# Patient Record
Sex: Male | Born: 1965
Health system: Southern US, Community
[De-identification: ages and names within clinical notes are randomized; demographics above are authoritative.]

## PROBLEM LIST (undated history)

## (undated) DIAGNOSIS — E039 Hypothyroidism, unspecified: Secondary | ICD-10-CM

## (undated) HISTORY — PX: WRIST SURGERY: SHX841

---
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
## 2012-10-10 ENCOUNTER — Emergency Department: Payer: Self-pay | Admitting: Emergency Medicine

## 2012-10-10 LAB — CBC
HCT: 43 % (ref 40.0–52.0)
MCH: 30.7 pg (ref 26.0–34.0)
Platelet: 239 10*3/uL (ref 150–440)
RBC: 4.8 10*6/uL (ref 4.40–5.90)

## 2012-10-10 LAB — COMPREHENSIVE METABOLIC PANEL
Albumin: 3.6 g/dL (ref 3.4–5.0)
Alkaline Phosphatase: 70 U/L (ref 50–136)
Anion Gap: 5 — ABNORMAL LOW (ref 7–16)
Bilirubin,Total: 0.5 mg/dL (ref 0.2–1.0)
Chloride: 106 mmol/L (ref 98–107)
Co2: 24 mmol/L (ref 21–32)
Creatinine: 0.85 mg/dL (ref 0.60–1.30)
EGFR (Non-African Amer.): >60
Glucose: 93 mg/dL (ref 65–99)
Potassium: 5.5 mmol/L — ABNORMAL HIGH (ref 3.5–5.1)
Sodium: 135 mmol/L — ABNORMAL LOW (ref 136–145)
Total Protein: 7.6 g/dL (ref 6.4–8.2)

## 2012-10-10 IMAGING — CT CT CERVICAL SPINE WITHOUT CONTRAST
1 series · 12 of 14 positions shown, 15 images · non-contrast
Comparison: None

REASON FOR EXAM: pain following trauma
COMMENTS:

PROCEDURE:     CT  - CT CERVICAL SPINE WO  - [DATE] [DATE]
RESULT:     Clinical Indication: Trauma
TECHNIQUE: Multiple axial CT images from the skull base to the mid vertebral
body of T1. obtained with sagittal and coronal reformatted images provided.

[Series 5: axial · axial · 0.32mm/px · z∈[-282,-111]mm · 12 of 102 slices shown, 15 images]
[im 8/102  soft-tissue]
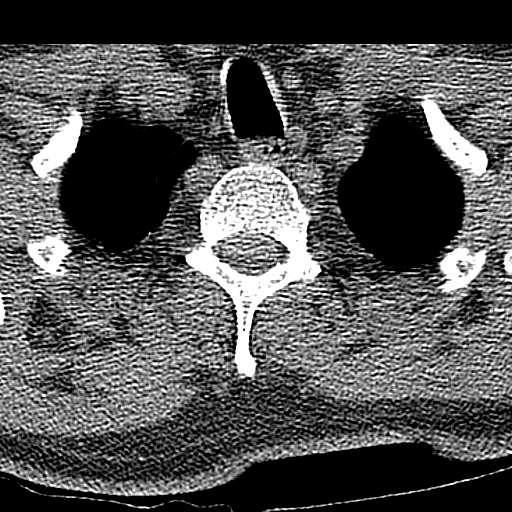
[im 8/102  bone]
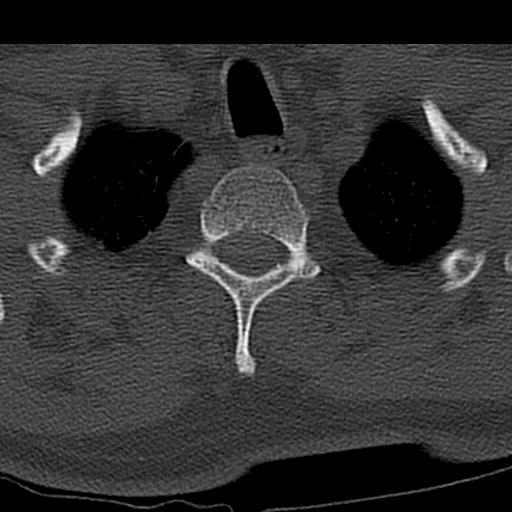
[im 16/102  bone]
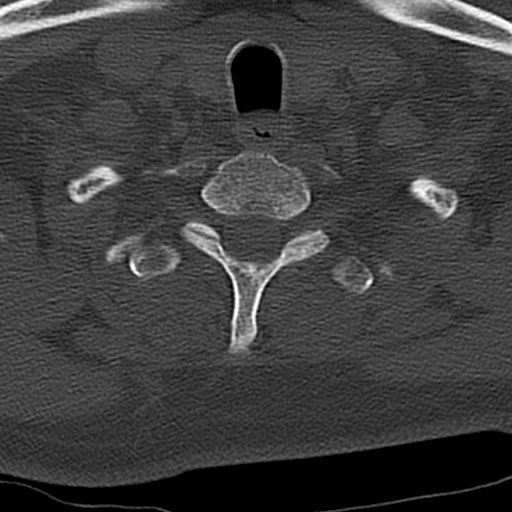
[im 24/102  bone]
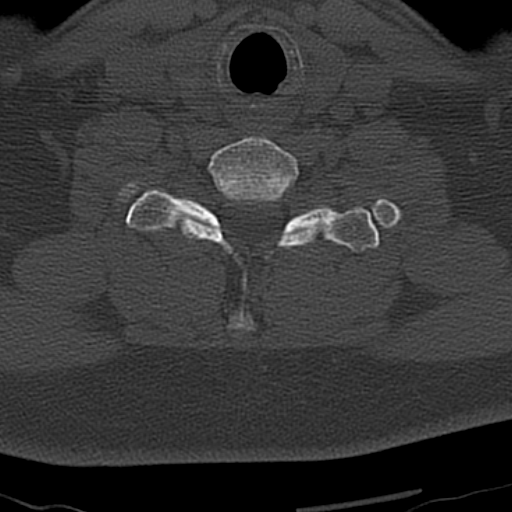
[im 32/102  bone]
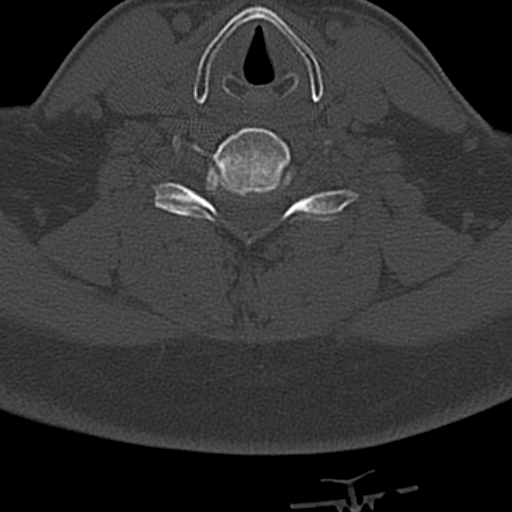
[im 39/102  soft-tissue]
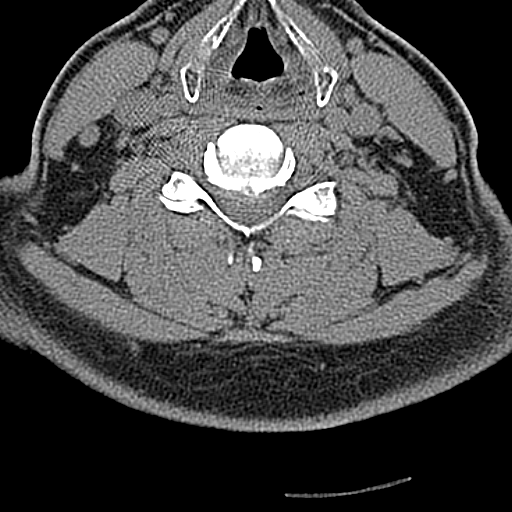
[im 39/102  bone]
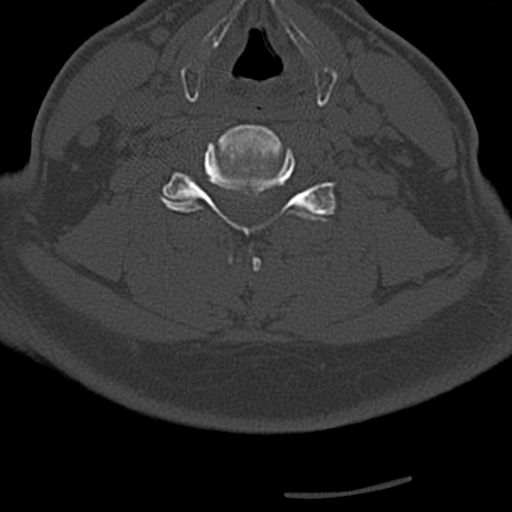
[im 47/102  bone]
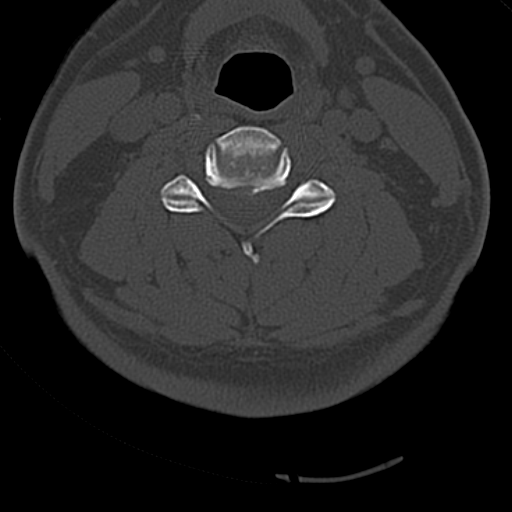
[im 55/102  bone]
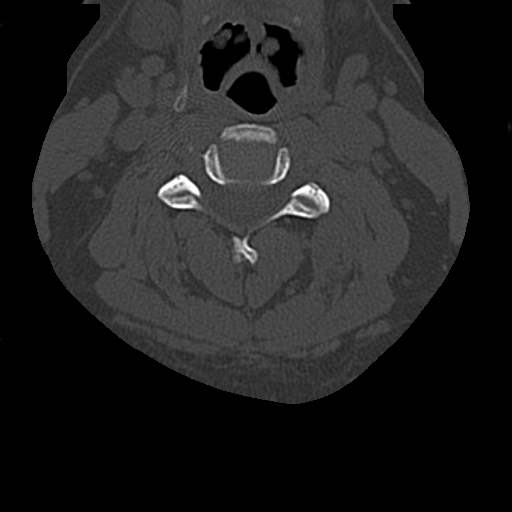
[im 63/102  bone]
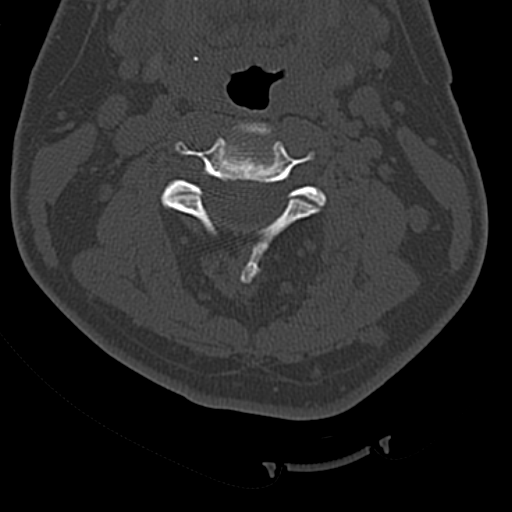
[im 70/102  soft-tissue]
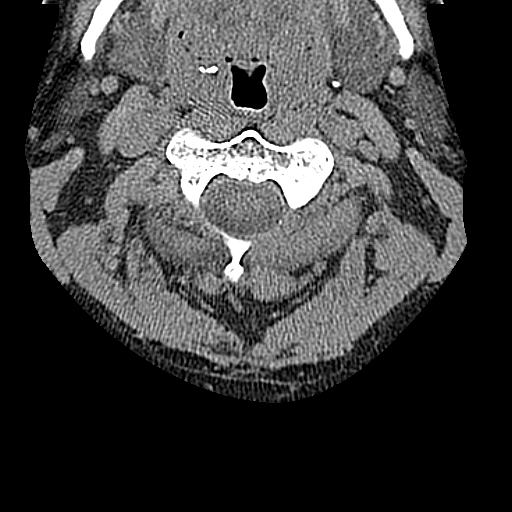
[im 70/102  bone]
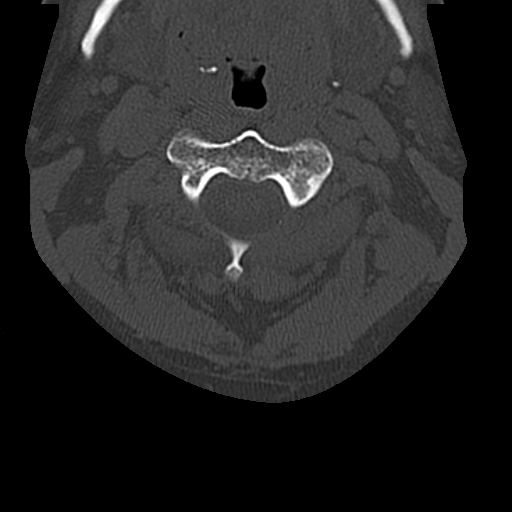
[im 78/102  bone]
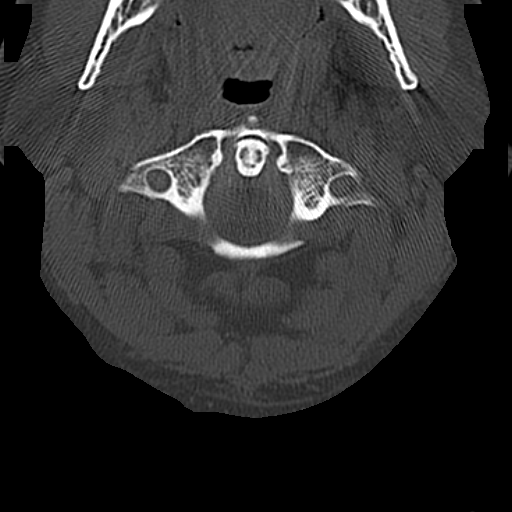
[im 86/102  bone]
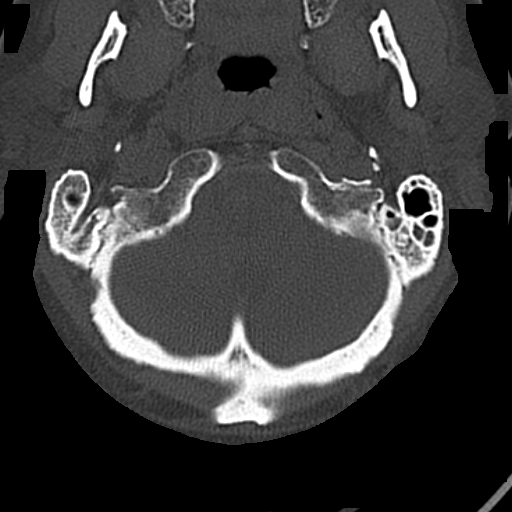
[im 94/102  bone]
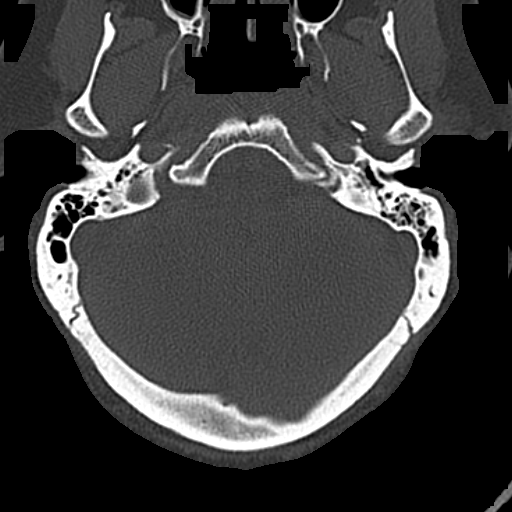

[12 of 14 positions shown; findings below may reference images not displayed]

FINDINGS: The alignment is anatomic. The vertebral body heights are maintained. There
is no acute fracture or static listhesis. The prevertebral soft tissues are
normal. The intraspinal soft tissues are not fully imaged on this
examination due to poor soft tissue contrast, but there is no soft tissue
gross abnormality.

There are broad-based discussed you by complexes at C4-C5, C5-C6 and C6-C7.

The visualized portions of the lung apices demonstrate no focal abnormality.
IMPRESSION: 1. No acute osseous injury of the cervical spine.

2. Ligamentous injury is not evaluated. If there is high clinical concern
for ligamentous injury, consider MRI or flexion/extension radiographs as
clinically indicated and tolerated.

[REDACTED]

## 2012-10-10 IMAGING — CT CT CHEST-ABD-PELV W/ CM
1 of 2 series · 15 of 32 positions shown, 19 images · non-contrast
Comparison: None

REASON FOR EXAM: (1) pain following trauma; (2) pain following trauma
COMMENTS:

PROCEDURE:     CT  - CT CHEST ABDOMEN AND PELVIS W  - [DATE] [DATE]
RESULT:     CT CHEST, ABDOMEN, AND PELVIS
HISTORY: Pain following trauma
TECHNIQUE: Multiple axial images obtained from the thoracic inlet to the
pubic symphysis, without p.o. contrast and with 100 ml of [DN]
intravenous contrast.

[Series 2: soft tissue · axial · 0.80mm/px · z∈[-926,-266]mm · 15 of 244 slices shown, 19 images]
[im 12/244  soft-tissue]
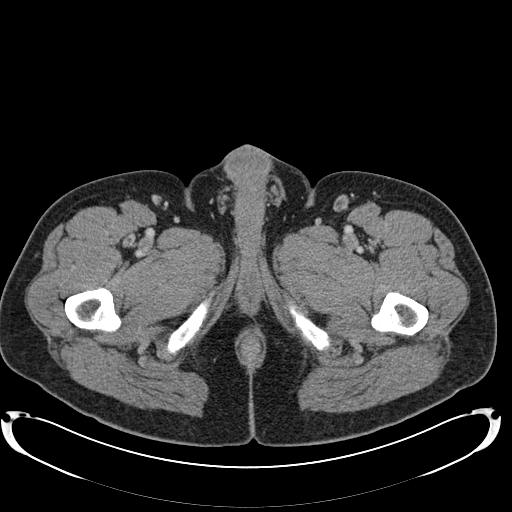
[im 12/244  bone]
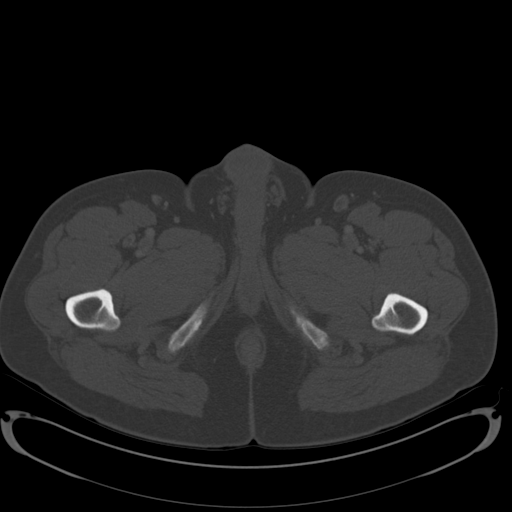
[im 34/244  soft-tissue]
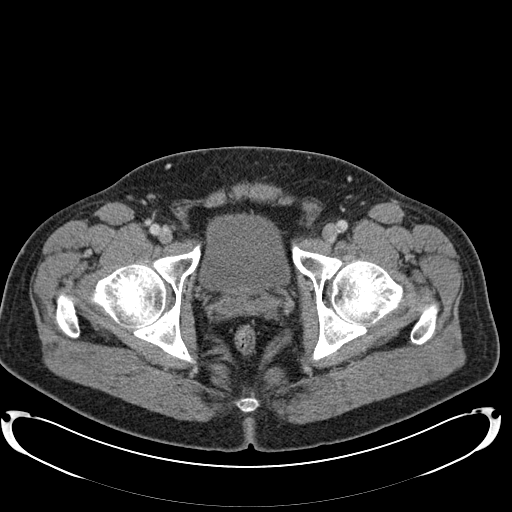
[im 56/244  soft-tissue]
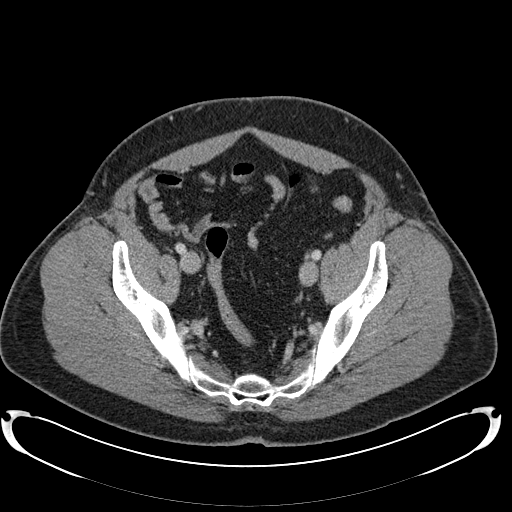
[im 67/244  soft-tissue]
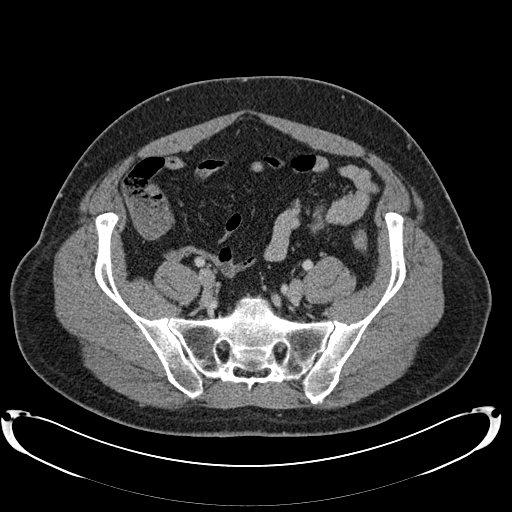
[im 89/244  soft-tissue]
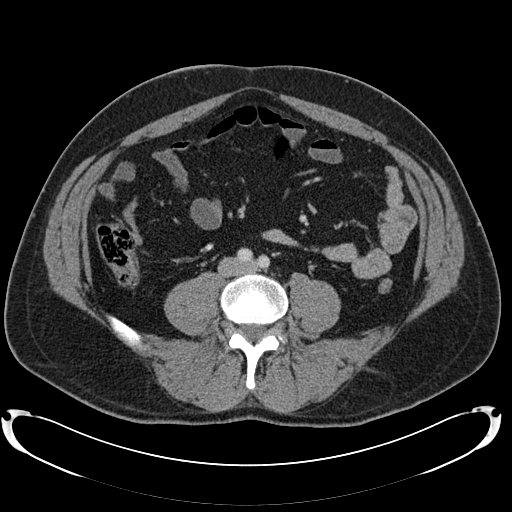
[im 100/244  soft-tissue]
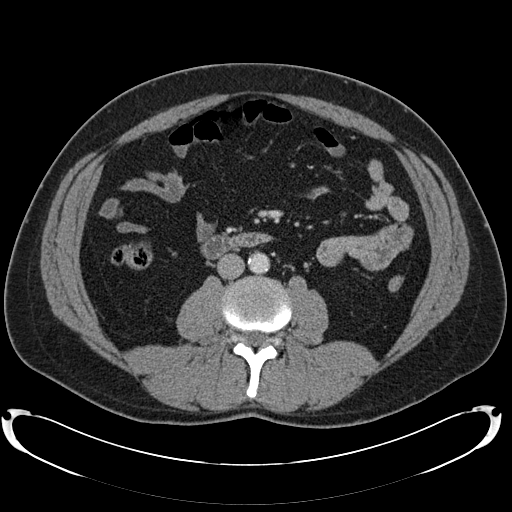
[im 122/244  soft-tissue]
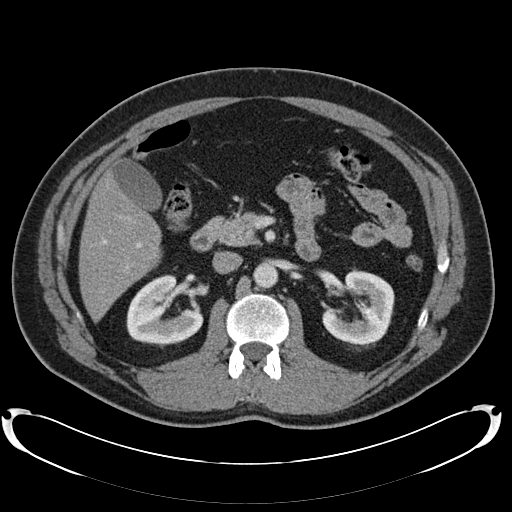
[im 144/244  soft-tissue]
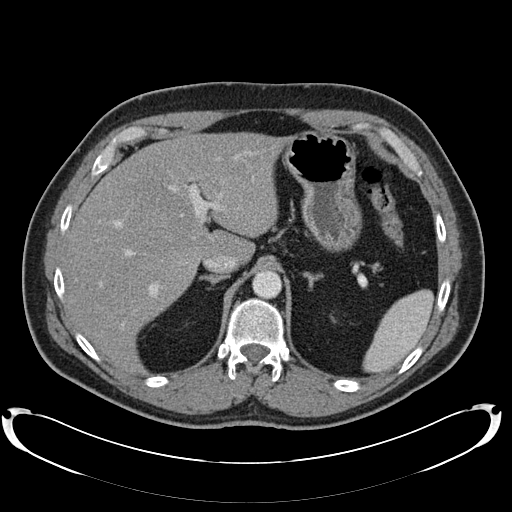
[im 155/244  soft-tissue]
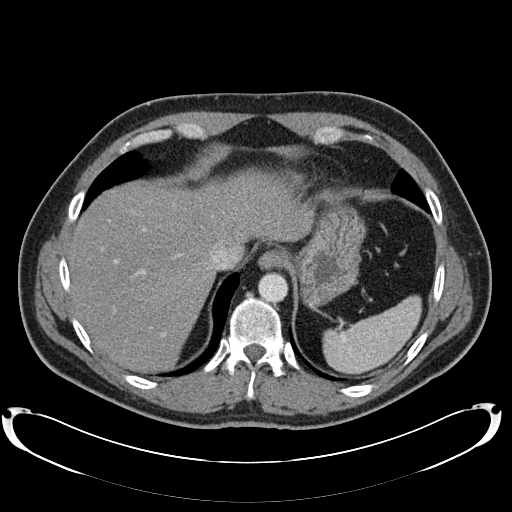
[im 155/244  bone]
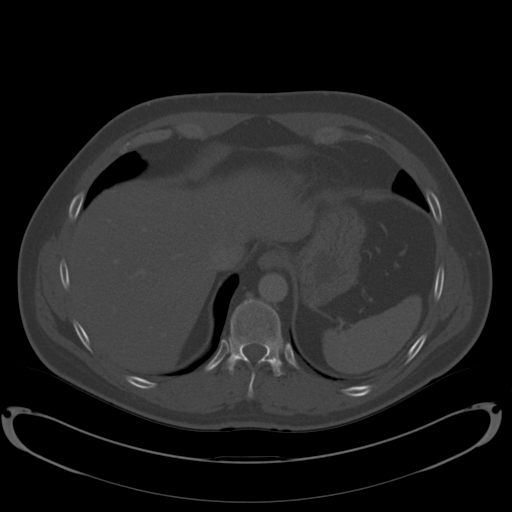
[im 177/244  soft-tissue]
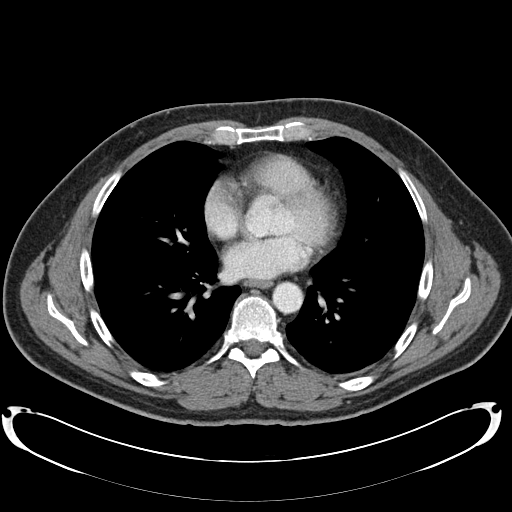
[im 188/244  soft-tissue]
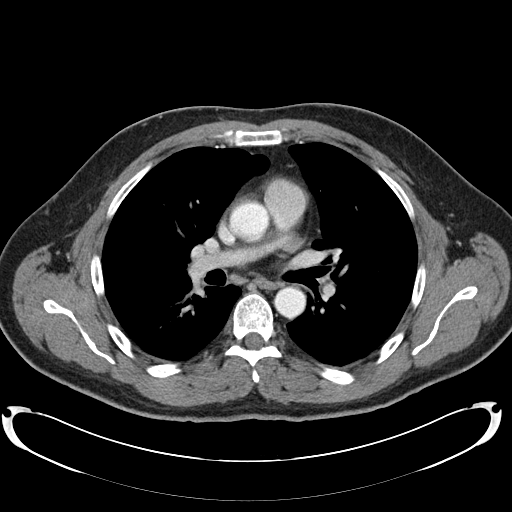
[im 199/244  lung]
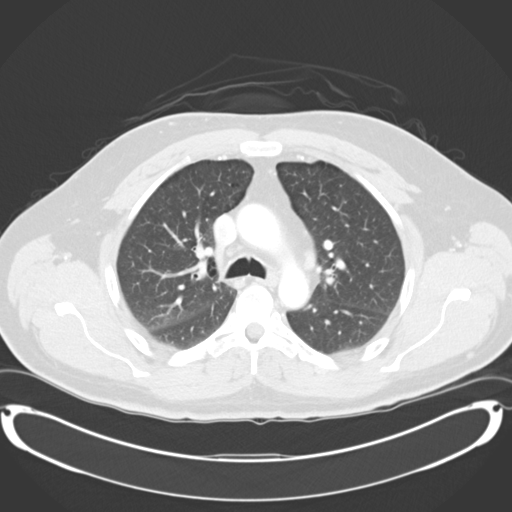
[im 210/244  soft-tissue]
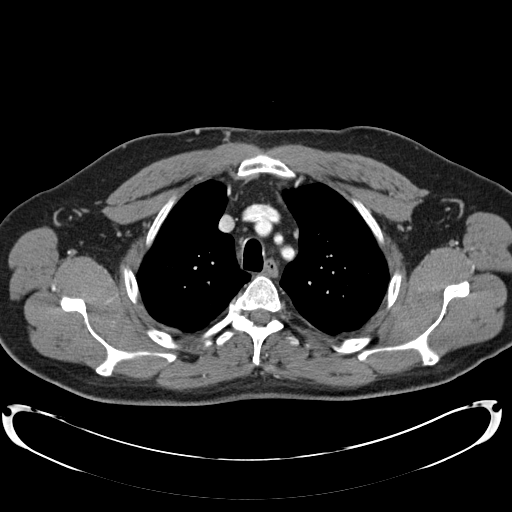
[im 210/244  lung]
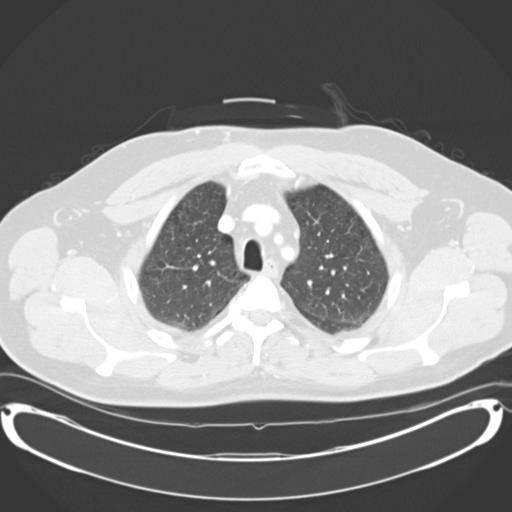
[im 221/244  lung]
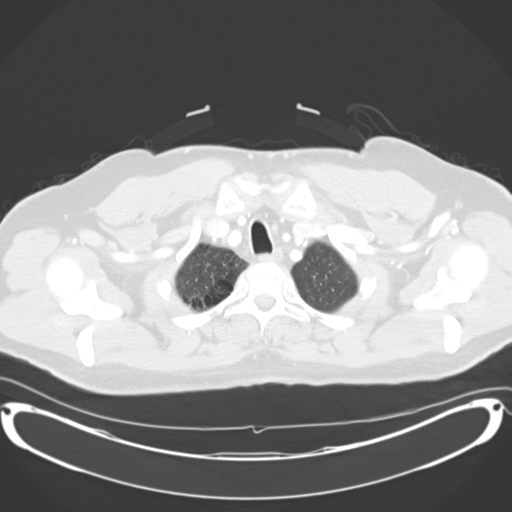
[im 232/244  soft-tissue]
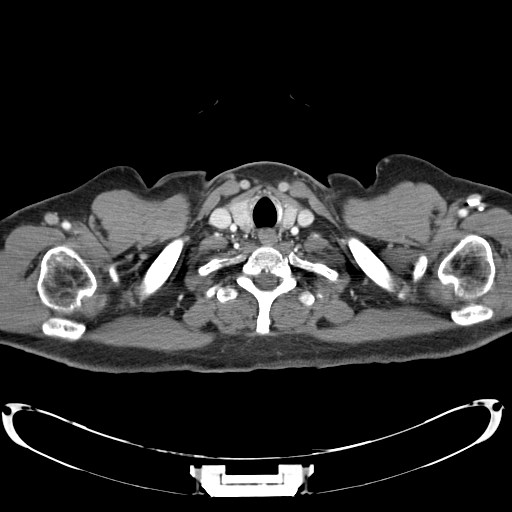
[im 232/244  lung]
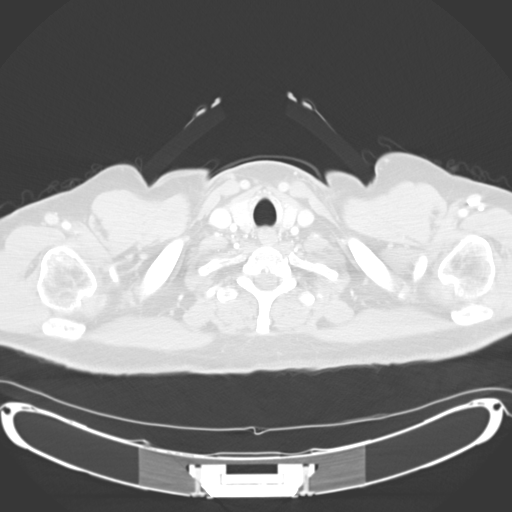

[15 of 32 positions shown; findings below may reference images not displayed]

FINDINGS: CHEST:

The lungs are clear. There are multiple right apical blebs. There is no
focal mass. There is no focal parenchymal opacity, pleural effusion, or
pneumothorax.

The heart size is normal. There is no pericardial effusion.

There are no pathologically enlarged mediastinal, hilar, or axillary lymph
nodes.

The osseous structures demonstrate no focal abnormality.

ABDOMEN/PELVIS:

The liver demonstrates no focal abnormality. There is no intrahepatic or
extrahepatic biliary ductal dilatation. The gallbladder is unremarkable. The
spleen demonstrates no focal abnormality. The kidneys, adrenal glands,
pancreas are normal. The bladder is unremarkable.

The stomach, duodenum, small intestine, and large intestine demonstrate no
contrast extravasation or dilatation. There is no pneumoperitoneum,
pneumatosis, or portal venous gas. There is no abdominal or pelvic free
fluid. There is no lymphadenopathy.

The abdominal aorta is normal in caliber with atherosclerosis.

The osseous structures are unremarkable.
IMPRESSION: 1. No acute injury of the chest, abdomen or pelvis.

[REDACTED]

## 2012-10-10 IMAGING — CR RIGHT FOREARM - 2 VIEW
1 series · 2 of 2 positions shown · non-contrast
Comparison: none

REASON FOR EXAM: pain following trauma
COMMENTS:

PROCEDURE:     DXR - DXR FOREARM RIGHT  - [DATE] [DATE]
RESULT:     Comparison: None.

[Series 1: ap · 0.17mm/px · 2 of 2 slices shown]
[im 1/2]
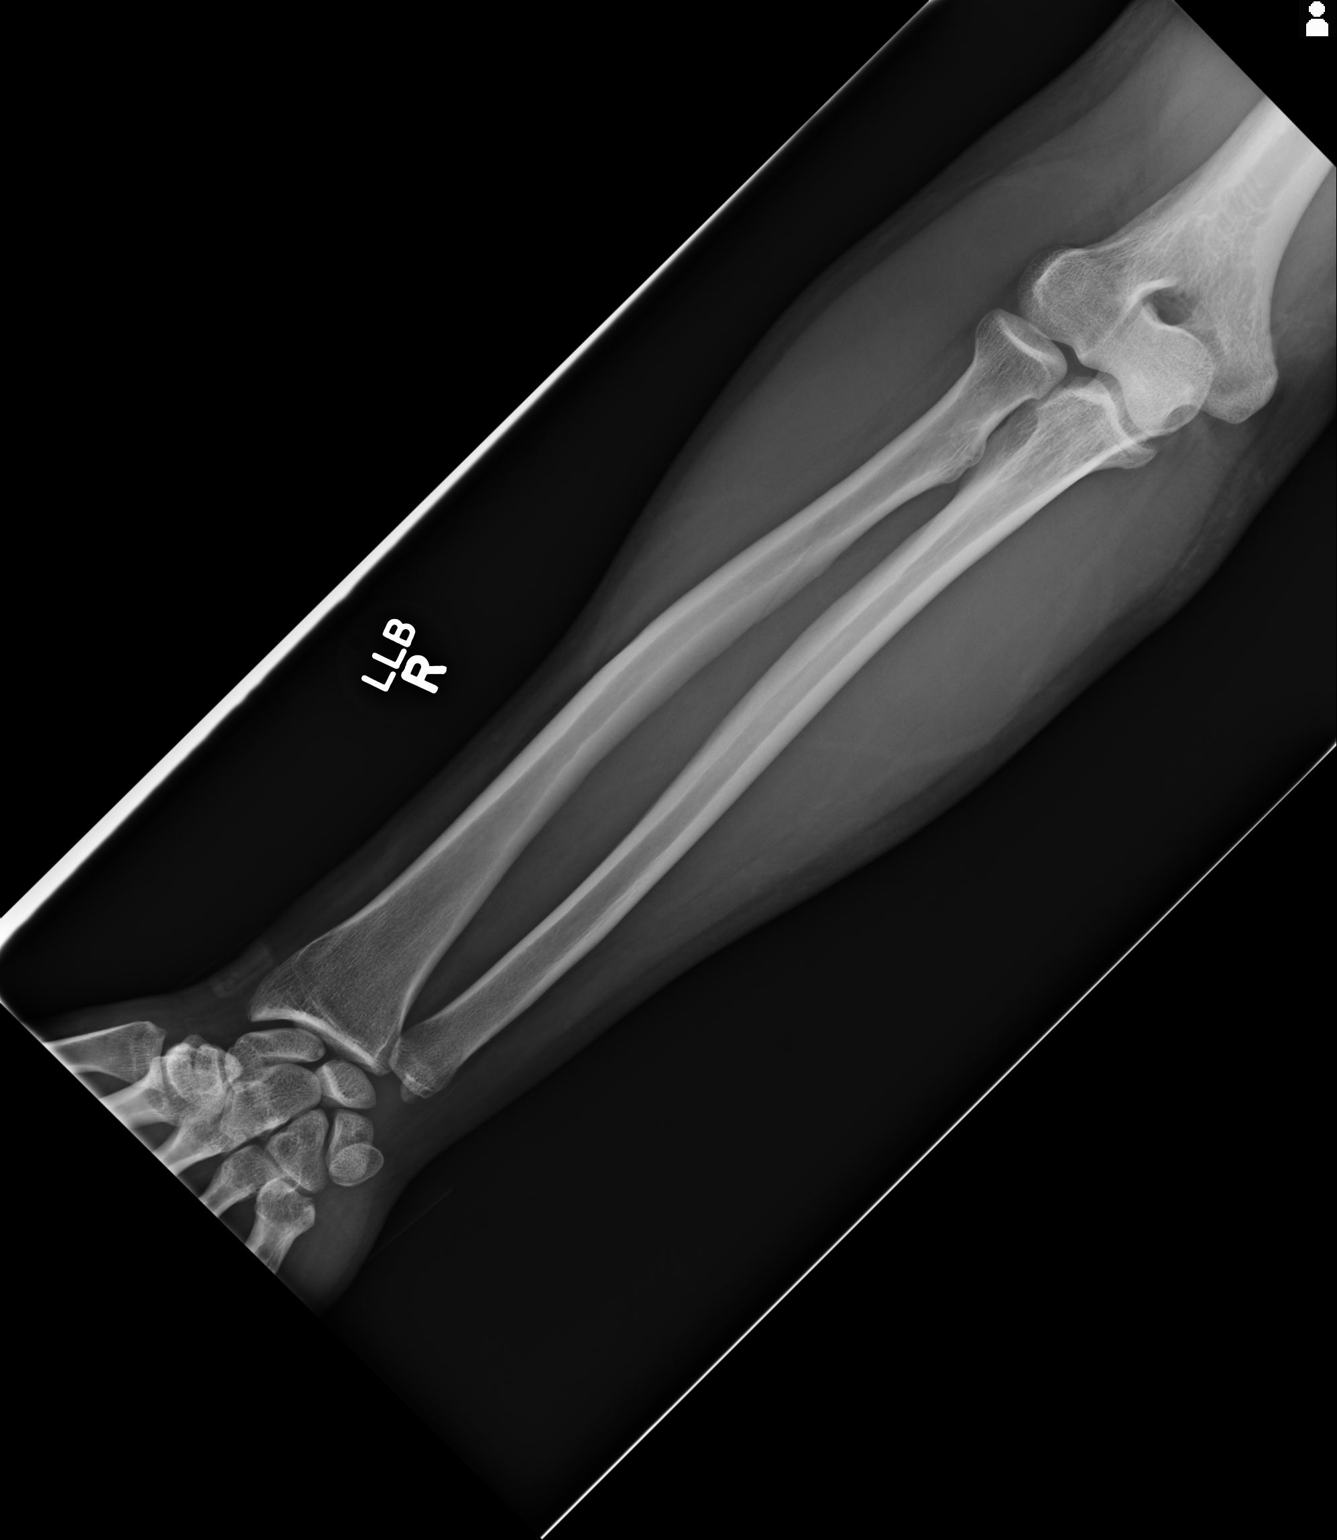
[im 2/2]
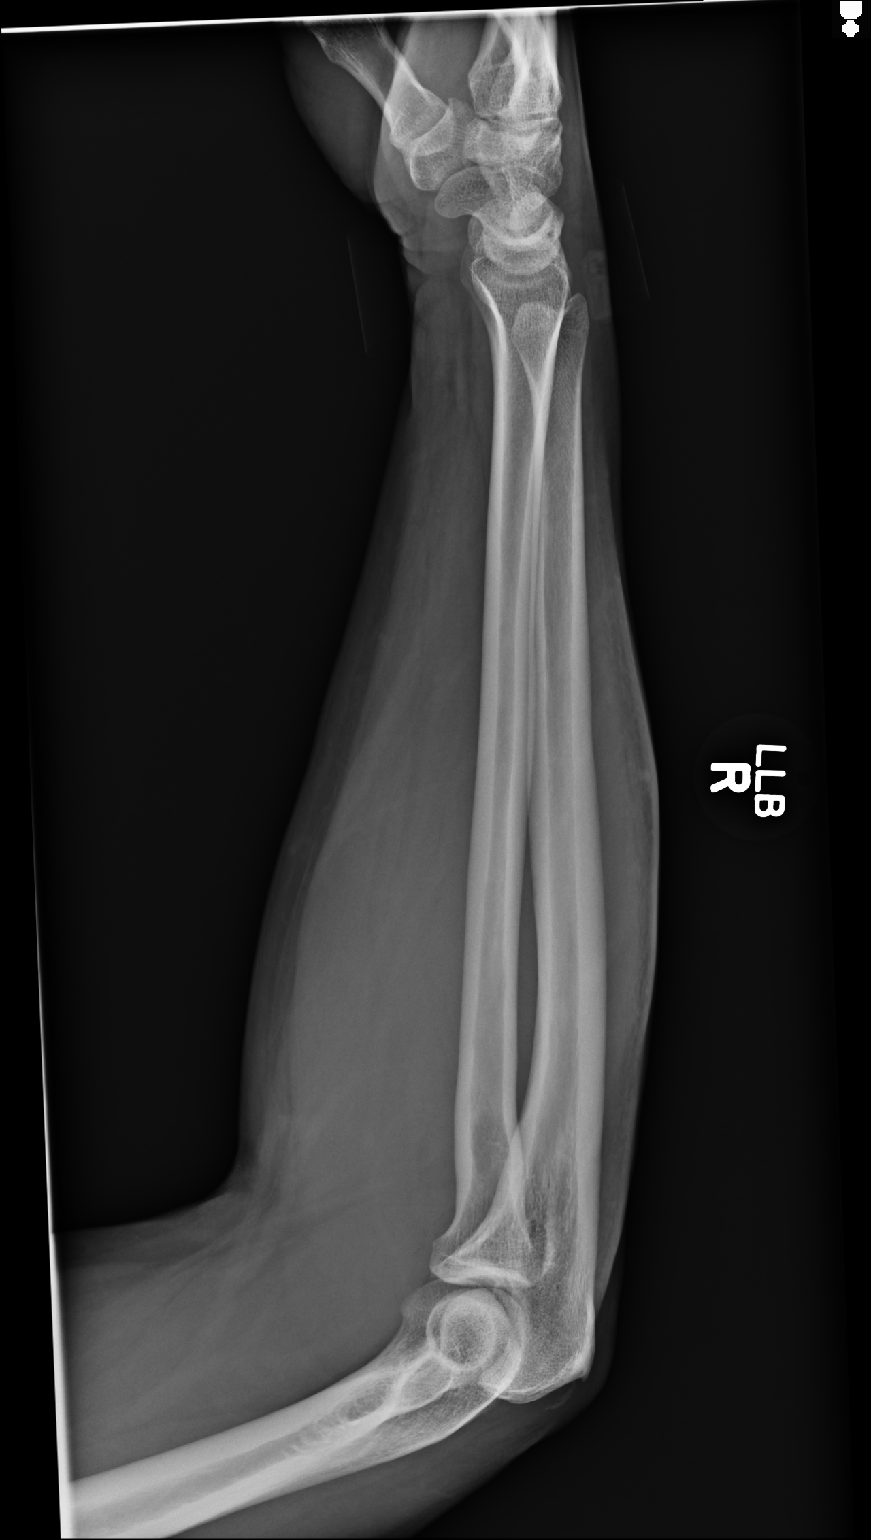

[2 of 2 positions shown; findings below may reference images not displayed]

FINDINGS: No acute fracture.
IMPRESSION: No acute fracture.

[REDACTED]

## 2016-09-14 ENCOUNTER — Ambulatory Visit: Payer: Self-pay | Admitting: Family Medicine

## 2017-09-11 ENCOUNTER — Encounter: Payer: Self-pay | Admitting: Physician Assistant

## 2017-09-11 ENCOUNTER — Ambulatory Visit: Payer: 59 | Admitting: Physician Assistant

## 2017-09-11 VITALS — BP 124/70 | HR 86 | Temp 98.5°F | Resp 16 | Ht 72.0 in | Wt 230.0 lb

## 2017-09-11 DIAGNOSIS — Z131 Encounter for screening for diabetes mellitus: Secondary | ICD-10-CM | POA: Diagnosis not present

## 2017-09-11 DIAGNOSIS — Z114 Encounter for screening for human immunodeficiency virus [HIV]: Secondary | ICD-10-CM | POA: Diagnosis not present

## 2017-09-11 DIAGNOSIS — J309 Allergic rhinitis, unspecified: Secondary | ICD-10-CM

## 2017-09-11 DIAGNOSIS — Z720 Tobacco use: Secondary | ICD-10-CM | POA: Diagnosis not present

## 2017-09-11 DIAGNOSIS — Z23 Encounter for immunization: Secondary | ICD-10-CM | POA: Diagnosis not present

## 2017-09-11 DIAGNOSIS — Z13220 Encounter for screening for lipoid disorders: Secondary | ICD-10-CM | POA: Diagnosis not present

## 2017-09-11 DIAGNOSIS — Z130 Encounter for screening for diseases of the blood and blood-forming organs and certain disorders involving the immune mechanism: Secondary | ICD-10-CM | POA: Diagnosis not present

## 2017-09-11 DIAGNOSIS — Z0000 Encounter for general adult medical examination without abnormal findings: Secondary | ICD-10-CM

## 2017-09-11 DIAGNOSIS — Z1329 Encounter for screening for other suspected endocrine disorder: Secondary | ICD-10-CM

## 2017-09-11 DIAGNOSIS — Z1211 Encounter for screening for malignant neoplasm of colon: Secondary | ICD-10-CM

## 2017-09-11 NOTE — Progress Notes (Signed)
Patient: Benjamin Oliver Male    DOB: 07-Jan-1966   53 y.o.   MRN: [id number] Visit Date: 09/13/2017  Today's Provider: Trinna Post, PA-C   Chief Complaint  Patient presents with  . New Patient (Initial Visit)   Subjective:    HPI   Patient comes in today to establish care, would like a physical exam today. He is a Careers information officer. 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 He has one biological child, and his wife has 5 other children.   He is currently smoking 1.5 packs per day, has been doing so since age 62. He does desire to quit. He does not want chantix. He reports his brother is currently dying of lung cancer.  Does not recall his last tetanus and he is visiting is daughter who just gave birth to new grandchild.       No Known Allergies   Current Outpatient Medications:  .  loratadine (CLARITIN) 10 MG tablet, Take 10 mg by mouth daily., Disp: , Rfl:  .  nicotine (NICODERM CQ) 21 mg/24hr patch, Place 1 patch (21 mg total) onto the skin daily for 28 days., Disp: 28 patch, Rfl: 0  Review of Systems  Constitutional: Negative.   HENT: Positive for congestion and sinus pressure.   Eyes: Negative.   Respiratory: Positive for cough.   Cardiovascular: Negative.   Gastrointestinal: Negative.   Endocrine: Negative.   Genitourinary: Negative.   Musculoskeletal: Negative.   Skin: Negative.   Allergic/Immunologic: Positive for environmental allergies.  Neurological: Negative.   Hematological: Negative.   Psychiatric/Behavioral: Negative.     Social History   Tobacco Use  . Smoking status: Not on file  Substance Use Topics  . Alcohol use: Not on file   Objective:   BP 124/70 (BP Location: Right Leg, Patient Position: Sitting, Cuff Size: Large)   Pulse 86   Temp 98.5 F (36.9 C)   Resp 16   Ht 6' (1.829 m)   Wt 230 lb (104.3 kg)   SpO2 99%   BMI 31.19 kg/m  Vitals:   09/11/17 1413  BP: 124/70  Pulse: 86  Resp: 16  Temp:  98.5 F (36.9 C)  SpO2: 99%  Weight: 230 lb (104.3 kg)  Height: 6' (1.829 m)     Physical Exam  Constitutional: He is oriented to person, place, and time. He appears well-developed and well-nourished.  HENT:  Head: Normocephalic and atraumatic.  Right Ear: External ear normal.  Left Ear: External ear normal.  Nose: Nose normal.  Mouth/Throat: No oropharyngeal exudate.  Eyes: Conjunctivae are normal. Right eye exhibits no discharge. Left eye exhibits no discharge.  Neck: Neck supple.  Cardiovascular: Normal rate and regular rhythm.  Pulmonary/Chest: Effort normal and breath sounds normal.  Abdominal: Soft. Bowel sounds are normal.  Lymphadenopathy:    He has no cervical adenopathy.  Neurological: He is alert and oriented to person, place, and time.  Skin: Skin is warm and dry.  Psychiatric: He has a normal mood and affect. His behavior is normal.        Assessment & Plan:     1. Annual physical exam   2. Allergic rhinitis, unspecified seasonality, unspecified trigger   3. Tobacco abuse  - nicotine (NICODERM CQ) 21 mg/24hr patch; Place 1 patch (21 mg total) onto the skin daily for 28 days.  Dispense: 28 patch; Refill: 0  4. Diabetes mellitus screening  - Comprehensive Metabolic Panel (  CMET)  5. Screening cholesterol level  - Lipid Profile  6. Thyroid disorder screening  - TSH  7. Screening for deficiency anemia  - CBC with Differential  8. Colon cancer screening  - Ambulatory referral to Gastroenterology  9. Need for Tdap vaccination  - Tdap vaccine greater than or equal to 7yo IM  10. Encounter for screening for HIV  - HIV antibody (with reflex)  Return in about 2 months (around 11/11/2017) for smoking.  The entirety of the information documented in the History of Present Illness, Review of Systems and Physical Exam were personally obtained by me. Portions of this information were initially documented by Wilburt Finlay, CMA and reviewed by me for  thoroughness and accuracy.           Trinna Post, PA-C  [facility]

## 2017-09-11 NOTE — Patient Instructions (Addendum)
Please take daily allergy medication. Do NOT take Mucinex D daily, or anything with a decongestant daily. May add flonase, 2 sprays each nostril daily.     Tobacco Use Disorder Tobacco use disorder (TUD) is a mental disorder. It is the long-term use of tobacco in spite of related health problems or difficulty with normal life activities. Tobacco is most commonly smoked as cigarettes and less commonly as cigars or pipes. Smokeless chewing tobacco and snuff are also popular. People with TUD get a feeling of extreme pleasure (euphoria) from using tobacco and have a desire to use it again and again. Repeated use of tobacco can cause problems. The addictive effects of tobacco are due mainly tothe ingredient nicotine. Nicotine also causes a rush of adrenaline (epinephrine) in the body. This leads to increased blood pressure, heart rate, and breathing rate. These changes may cause problems for people with high blood pressure, weak hearts, or lung disease. High doses of nicotine in children and pets can lead to seizures and death. Tobacco contains a number of other unsafe chemicals. These chemicals are especially harmful when inhaled as smoke and can damage almost every organ in the body. Smokers live shorter lives than nonsmokers and are at risk of dying from a number of diseases and cancers. Tobacco smoke can also cause health problems for nonsmokers (due to inhaling secondhand smoke). Smoking is also a fire hazard. TUD usually starts in the late teenage years and is most common in young adults between the ages of 70 and 1 years. People who start smoking earlier in life are more likely to continue smoking as adults. TUD is somewhat more common in men than women. People with TUD are at higher risk for using alcohol and other drugs of abuse. What increases the risk? Risk factors for TUD include:  Having family members with the disorder.  Being around people who use tobacco.  Having an existing mental  health issue such as schizophrenia, depression, bipolar disorder, ADHD, or posttraumatic stress disorder (PTSD).  What are the signs or symptoms? People with tobacco use disorder have two or more of the following signs and symptoms within 12 months:  Use of more tobacco over a longer period than intended.  Not able to cut down or control tobacco use.  A lot of time spent obtaining or using tobacco.  Strong desire or urge to use tobacco (craving). Cravings may last for 6 months or longer after quitting.  Use of tobacco even when use leads to major problems at work, school, or home.  Use of tobacco even when use leads to relationship problems.  Giving up or cutting down on important life activities because of tobacco use.  Repeatedly using tobacco in situations where it puts you or others in physical danger, like smoking in bed.  Use of tobacco even when it is known that a physical or mental problem is likely related to tobacco use. ? Physical problems are numerous and may include chronic bronchitis, emphysema, lung and other cancers, gum disease, high blood pressure, heart disease, and stroke. ? Mental problems caused by tobacco may include difficulty sleeping and anxiety.  Need to use greater amounts of tobacco to get the same effect. This means you have developed a tolerance.  Withdrawal symptoms as a result of stopping or rapidly cutting back use. These symptoms may last a month or more after quitting and include the following: ? Depressed, anxious, or irritable mood. ? Difficulty concentrating. ? Increased appetite. ? Restlessness or trouble sleeping. ?  Use of tobacco to avoid withdrawal symptoms.  How is this diagnosed? Tobacco use disorder is diagnosed by your health care provider. A diagnosis may be made by:  Your health care provider asking questions about your tobacco use and any problems it may be causing.  A physical exam.  Lab tests.  You may be referred to a  mental health professional or addiction specialist.  The severity of tobacco use disorder depends on the number of signs and symptoms you have:  Mild--Two or three symptoms.  Moderate--Four or five symptoms.  Severe--Six or more symptoms.  How is this treated? Many people with tobacco use disorder are unable to quit on their own and need help. Treatment options include the following:  Nicotine replacement therapy (NRT). NRT provides nicotine without the other harmful chemicals in tobacco. NRT gradually lowers the dosage of nicotine in the body and reduces withdrawal symptoms. NRT is available in over-the-counter forms (gum, lozenges, and skin patches) as well as prescription forms (mouth inhaler and nasal spray).  Medicines.This may include: ? Antidepressant medicine that may reduce nicotine cravings. ? A medicine that acts on nicotine receptors in the brain to reduce cravings and withdrawal symptoms. It may also block the effects of tobacco in people with TUD who relapse.  Counseling or talk therapy. A form of talk therapy called behavioral therapy is commonly used to treat people with TUD. Behavioral therapy looks at triggers for tobacco use, how to avoid them, and how to cope with cravings. It is most effective in person or by phone but is also available in self-help forms (books and Internet websites).  Support groups. These provide emotional support, advice, and guidance for quitting tobacco.  The most effective treatment for TUD is usually a combination of medicine, talk therapy, and support groups. Follow these instructions at home:  Keep all follow-up visits as directed by your health care provider. This is important.  Take medicines only as directed by your health care provider.  Check with your health care provider before starting new prescription or over-the-counter medicines. Contact a health care provider if:  You are not able to take your medicines as  prescribed.  Treatment is not helping your TUD and your symptoms get worse. Get help right away if:  You have serious thoughts about hurting yourself or others.  You have trouble breathing, chest pain, sudden weakness, or sudden numbness in part of your body. This information is not intended to replace advice given to you by your health care provider. Make sure you discuss any questions you have with your health care provider. Document Released: 01/12/2004 Document Revised: 01/09/2016 Document Reviewed: 07/04/2013 Elsevier Interactive Patient Education  [employer].

## 2017-09-12 ENCOUNTER — Telehealth: Payer: Self-pay

## 2017-09-12 LAB — COMPREHENSIVE METABOLIC PANEL
ALT: 31 IU/L (ref 0–44)
AST: 18 IU/L (ref 0–40)
Albumin/Globulin Ratio: 1.6 (ref 1.2–2.2)
Albumin: 4.6 g/dL (ref 3.5–5.5)
Alkaline Phosphatase: 76 IU/L (ref 39–117)
BUN/Creatinine Ratio: 14 (ref 9–20)
BUN: 12 mg/dL (ref 6–24)
Bilirubin Total: 0.2 mg/dL (ref 0.0–1.2)
CO2: 22 mmol/L (ref 20–29)
Calcium: 9.3 mg/dL (ref 8.7–10.2)
Chloride: 102 mmol/L (ref 96–106)
Creatinine, Ser: 0.88 mg/dL (ref 0.76–1.27)
GFR calc Af Amer: 113 mL/min/{1.73_m2} (ref >59)
GFR calc non Af Amer: 98 mL/min/{1.73_m2} (ref >59)
Globulin, Total: 2.9 g/dL (ref 1.5–4.5)
Glucose: 80 mg/dL (ref 65–99)
Potassium: 4.3 mmol/L (ref 3.5–5.2)
Sodium: 140 mmol/L (ref 134–144)
Total Protein: 7.5 g/dL (ref 6.0–8.5)

## 2017-09-12 LAB — CBC WITH DIFFERENTIAL/PLATELET
Basophils Absolute: 0.1 10*3/uL (ref 0.0–0.2)
Basos: 1 %
EOS (ABSOLUTE): 0.3 10*3/uL (ref 0.0–0.4)
Eos: 3 %
Hematocrit: 44.6 % (ref 37.5–51.0)
Hemoglobin: 15 g/dL (ref 13.0–17.7)
Immature Grans (Abs): 0 10*3/uL (ref 0.0–0.1)
Immature Granulocytes: 0 %
Lymphocytes Absolute: 3.8 10*3/uL — ABNORMAL HIGH (ref 0.7–3.1)
Lymphs: 40 %
MCH: 31.5 pg (ref 26.6–33.0)
MCHC: 33.6 g/dL (ref 31.5–35.7)
MCV: 94 fL (ref 79–97)
Monocytes Absolute: 0.9 10*3/uL (ref 0.1–0.9)
Monocytes: 9 %
Neutrophils Absolute: 4.4 10*3/uL (ref 1.4–7.0)
Neutrophils: 47 %
Platelets: 291 10*3/uL (ref 150–379)
RBC: 4.76 x10E6/uL (ref 4.14–5.80)
RDW: 14.1 % (ref 12.3–15.4)
WBC: 9.4 10*3/uL (ref 3.4–10.8)

## 2017-09-12 LAB — LIPID PANEL
Chol/HDL Ratio: 4.4 ratio (ref 0.0–5.0)
Cholesterol, Total: 198 mg/dL (ref 100–199)
HDL: 45 mg/dL (ref >39)
LDL Calculated: 119 mg/dL — ABNORMAL HIGH (ref 0–99)
Triglycerides: 168 mg/dL — ABNORMAL HIGH (ref 0–149)
VLDL Cholesterol Cal: 34 mg/dL (ref 5–40)

## 2017-09-12 LAB — TSH: TSH: 18.49 u[IU]/mL — ABNORMAL HIGH (ref 0.450–4.500)

## 2017-09-12 LAB — HIV ANTIBODY (ROUTINE TESTING W REFLEX): HIV Screen 4th Generation wRfx: NONREACTIVE

## 2017-09-12 MED ORDER — NICOTINE 21 MG/24HR TD PT24: 21.0000 mg | MEDICATED_PATCH | Freq: Every day | TRANSDERMAL | 0 refills | Status: DC

## 2017-09-12 NOTE — Telephone Encounter (Signed)
LMTCB

## 2017-09-12 NOTE — Telephone Encounter (Signed)
-----   Message from Trinna Post, [place] sent at 09/12/2017  8:28 AM EDT ----- TSH is elevated, which could mean hypothyroidism. Can we add free T3 and T4 under dx elevated TSH if pt agreeable? Sugars normal, liver and kidney function normal. HIV negative, CBC normal. 10 year risk score for heart attack or stroke is 9.5% currently and would require a cholesterol lowering medication. However, if he were to quit smoking, this risk would be reduced enough so that he wouldn't need it. I can send in a cholesterol medication 10 mg Lipitor, or we can follow up in two months and regroup on quitting smoking. Let me know what he wants.

## 2017-09-13 MED ORDER — NICOTINE 14 MG/24HR TD PT24: MEDICATED_PATCH | TRANSDERMAL | 0 refills | Status: DC

## 2017-09-13 MED ORDER — NICOTINE 7 MG/24HR TD PT24: MEDICATED_PATCH | TRANSDERMAL | 0 refills | Status: DC

## 2017-09-13 MED ORDER — NICOTINE 21 MG/24HR TD PT24: MEDICATED_PATCH | TRANSDERMAL | 0 refills | Status: DC

## 2017-09-17 NOTE — Telephone Encounter (Signed)
Test added  Thanks,   -Foy Vanduyne  

## 2017-09-25 ENCOUNTER — Telehealth: Payer: Self-pay

## 2017-09-25 LAB — SPECIMEN STATUS REPORT

## 2017-09-25 LAB — T3, FREE: T3, Free: 3.1 pg/mL (ref 2.0–4.4)

## 2017-09-25 LAB — T4, FREE: Free T4: 0.74 ng/dL — ABNORMAL LOW (ref 0.82–1.77)

## 2017-09-25 NOTE — Telephone Encounter (Signed)
Left message to call back  

## 2017-09-25 NOTE — Telephone Encounter (Signed)
-----   Message from Trinna Post, [place] sent at 09/24/2017  5:03 PM EDT ----- Labs show hypothyroidism. Will send in thyroid replacement at 75 mcg if patient is agreeable to start this. We can have him follow up in one month to check how he does and redraw labs.

## 2017-09-27 NOTE — Telephone Encounter (Signed)
LMTCB 09/27/2017  Thanks,   -Mickel Baas

## 2017-09-28 NOTE — Telephone Encounter (Signed)
[id number] 

## 2017-10-11 ENCOUNTER — Encounter: Payer: Self-pay | Admitting: *Deleted

## 2017-10-11 NOTE — Telephone Encounter (Signed)
LMTCB 10/11/2017  Thanks,   -Mickel Baas

## 2017-10-29 ENCOUNTER — Encounter: Payer: Self-pay | Admitting: Family Medicine

## 2017-10-29 ENCOUNTER — Ambulatory Visit: Payer: 59 | Admitting: Family Medicine

## 2017-10-29 VITALS — BP 110/68 | HR 74 | Temp 98.7°F | Wt 231.4 lb

## 2017-10-29 DIAGNOSIS — K140 Glossitis: Secondary | ICD-10-CM | POA: Diagnosis not present

## 2017-10-29 MED ORDER — FIRST-DUKES MOUTHWASH MT SUSP: OROMUCOSAL | 0 refills | Status: DC

## 2017-10-29 NOTE — Progress Notes (Signed)
Patient: Benjamin Oliver Male    DOB: 02-03-1966   52 y.o.   MRN: [id number] Visit Date: 10/29/2017  Today's Provider: Vernie Murders, PA   Chief Complaint  Patient presents with  . Tongue Soreness   Subjective:    HPI Patient presents today C/O tongue soreness. He reports he is having difficulty eating due to pain. The pain started on Thursday and is worsening since onset. He states it appears to be a blister on left side of tongue.     History reviewed. No pertinent past medical history. Patient Active Problem List   Diagnosis Date Noted  . Allergic rhinitis 09/11/2017   Past Surgical History:  Procedure Laterality Date  . LEG SURGERY  1984   Family History  Problem Relation Age of Onset  . Cancer Brother        lung cancer   No Known Allergies  Current Outpatient Medications:  .  loratadine (CLARITIN) 10 MG tablet, Take 10 mg by mouth daily., Disp: , Rfl:  .  nicotine (NICODERM CQ) 14 mg/24hr patch, Place single new patch on arm daily for weeks 7-8. (Patient not taking: Reported on 10/29/2017), Disp: 14 patch, Rfl: 0 .  nicotine (NICODERM CQ) 21 mg/24hr patch, Place single new patch on arm daily for weeks 1-6. (Patient not taking: Reported on 10/29/2017), Disp: 42 patch, Rfl: 0 .  nicotine (NICODERM CQ) 7 mg/24hr patch, Place single new patch on arm daily for weeks 9-10 (Patient not taking: Reported on 10/29/2017), Disp: 14 patch, Rfl: 0  Review of Systems  Constitutional: Negative.   HENT:       Tongue soreness   Respiratory: Negative.   Cardiovascular: Negative.    Social History   Tobacco Use  . Smoking status: Current Every Day Smoker    Packs/day: 1.00    Types: Cigarettes  . Smokeless tobacco: Never Used  Substance Use Topics  . Alcohol use: Yes    Comment: occassionally   Objective:   BP 110/68 (BP Location: Right Arm, Patient Position: Sitting, Cuff Size: Normal)   Pulse 74   Temp 98.7 F (37.1 C) (Oral)   Wt 231 lb 6.4 oz (105 kg)   SpO2 96%    BMI 31.38 kg/m   Physical Exam  Constitutional: He is oriented to person, place, and time. He appears well-developed and well-nourished. No distress.  HENT:  Head: Normocephalic and atraumatic.  Right Ear: Hearing normal.  Left Ear: Hearing normal.  Nose: Nose normal.  6-7 mm ulcer on the left mid portion of the tongue. Tender but not bleeding. No local lymphadenopathy.  Eyes: Conjunctivae and lids are normal. Right eye exhibits no discharge. Left eye exhibits no discharge. No scleral icterus.  Pulmonary/Chest: Effort normal. No respiratory distress.  Musculoskeletal: Normal range of motion.  Neurological: He is alert and oriented to person, place, and time.  Skin: Skin is intact. No lesion and no rash noted.  Psychiatric: He has a normal mood and affect. His speech is normal and behavior is normal. Thought content normal.      Assessment & Plan:     1. Tongue ulcer Onset 10-25-17 without known injury. Is a tobacco smoker but does not chew or dip. Ulcer is tender and burns with hot, spicy or acidic foods. Ice is soothing. Will treat suspected aphthous ulcer with Duke's Mouthwash and recheck in a week. May need referral to ENT if no better at that time. - Diphenhyd-Hydrocort-Nystatin (FIRST-DUKES MOUTHWASH) SUSP; Gargle with 5 cc  for 30 seconds after meals and bedtime.  Dispense: 237 mL; Refill: 0       Vernie Murders, PA  [facility]

## 2017-11-13 ENCOUNTER — Ambulatory Visit: Payer: Self-pay | Admitting: Physician Assistant

## 2017-11-13 NOTE — Progress Notes (Deleted)
       Patient: Benjamin Oliver Male    DOB: 05-13-1966   52 y.o.   MRN: [id number] Visit Date: 11/13/2017  Today's Provider: Trinna Post, PA-C   No chief complaint on file.  Subjective:    HPI Patient here today for 2 month follow-up smoking. Patient was prescribed nicotine (NICODERM CQ) 21 mg/24hr patch.  Patient was also seen 1 week ago for tongue ulcer.Onset 10-25-17 without known injury patient was treated suspected aphthous ulcer with Duke's Mouthwash.    No Known Allergies   Current Outpatient Medications:  .  Diphenhyd-Hydrocort-Nystatin (FIRST-DUKES MOUTHWASH) SUSP, Gargle with 5 cc for 30 seconds after meals and bedtime., Disp: 237 mL, Rfl: 0 .  loratadine (CLARITIN) 10 MG tablet, Take 10 mg by mouth daily., Disp: , Rfl:  .  nicotine (NICODERM CQ) 14 mg/24hr patch, Place single new patch on arm daily for weeks 7-8. (Patient not taking: Reported on 10/29/2017), Disp: 14 patch, Rfl: 0 .  nicotine (NICODERM CQ) 21 mg/24hr patch, Place single new patch on arm daily for weeks 1-6. (Patient not taking: Reported on 10/29/2017), Disp: 42 patch, Rfl: 0 .  nicotine (NICODERM CQ) 7 mg/24hr patch, Place single new patch on arm daily for weeks 9-10 (Patient not taking: Reported on 10/29/2017), Disp: 14 patch, Rfl: 0  Review of Systems  Social History   Tobacco Use  . Smoking status: Current Every Day Smoker    Packs/day: 1.00    Types: Cigarettes  . Smokeless tobacco: Never Used  Substance Use Topics  . Alcohol use: Yes    Comment: occassionally   Objective:   There were no vitals taken for this visit.   Physical Exam      Assessment & Plan:           Trinna Post, PA-C  [facility]

## 2018-03-30 ENCOUNTER — Telehealth: Payer: Self-pay

## 2018-03-30 NOTE — Telephone Encounter (Signed)
Patient called stating that he has had a sudden dropping of the right side of his face since yesterday. He states that drooping is starting to affect his speech. He states he was having headaches yesterday. I advised patient that he should go to the ER now for an evaluation to rule out stroke. Patient refused to go to the ER. He states he has had Bells Palsy in the past on his left side, and he went to the ER then and they didn't do much. Patient requested that I schedule an appointment for him to come in Monday. Appointment was scheduled at 8:40am with Simona Huh. I strongly advised patient to go to the ER and her refused again. Dr. Rosanna Randy was advised of this phone call.

## 2018-04-01 ENCOUNTER — Other Ambulatory Visit: Payer: Self-pay

## 2018-04-01 ENCOUNTER — Encounter: Payer: Self-pay | Admitting: Family Medicine

## 2018-04-01 ENCOUNTER — Ambulatory Visit: Payer: 59 | Admitting: Family Medicine

## 2018-04-01 VITALS — BP 140/98 | HR 84 | Temp 98.4°F | Ht 72.0 in | Wt 229.0 lb

## 2018-04-01 DIAGNOSIS — G510 Bell's palsy: Secondary | ICD-10-CM | POA: Diagnosis not present

## 2018-04-01 DIAGNOSIS — Z720 Tobacco use: Secondary | ICD-10-CM | POA: Diagnosis not present

## 2018-04-01 DIAGNOSIS — E039 Hypothyroidism, unspecified: Secondary | ICD-10-CM | POA: Diagnosis not present

## 2018-04-01 MED ORDER — LEVOTHYROXINE SODIUM 50 MCG PO TABS: 50.0000 ug | ORAL_TABLET | Freq: Every day | ORAL | 3 refills | Status: DC

## 2018-04-01 MED ORDER — PREDNISONE 5 MG PO TABS: 5.0000 mg | ORAL_TABLET | Freq: Every day | ORAL | 0 refills | Status: DC

## 2018-04-01 MED ORDER — NICOTINE 7 MG/24HR TD PT24: MEDICATED_PATCH | TRANSDERMAL | 0 refills | Status: DC

## 2018-04-01 NOTE — Patient Instructions (Signed)
Bell Palsy, Adult Bell palsy is a short-term inability to move muscles in part of the face. The inability to move (paralysis) results from inflammation or compression of the facial nerve, which travels along the skull and under the ear to the side of the face (7th cranial nerve). This nerve is responsible for facial movements that include blinking, closing the eyes, smiling, and frowning. What are the causes? The exact cause of this condition is not known. It may be caused by an infection from a virus, such as the chickenpox (herpes zoster), Epstein-Barr, or mumps virus. What increases the risk? You are more likely to develop this condition if:  You are pregnant.  You have diabetes.  You have had a recent infection in your nose, throat, or airways (upper respiratory infection).  You have a weakened body defense system (immune system).  You have had a facial injury, such as a fracture.  You have a family history of Bell palsy.  What are the signs or symptoms? Symptoms of this condition include:  Weakness on one side of the face.  Drooping eyelid and corner of the mouth.  Excessive tearing in one eye.  Difficulty closing the eyelid.  Dry eye.  Drooling.  Dry mouth.  Changes in taste.  Change in facial appearance.  Pain behind one ear.  Ringing in one or both ears.  Sensitivity to sound in one ear.  Facial twitching.  Headache.  Impaired speech.  Dizziness.  Difficulty eating or drinking.  Most of the time, only one side of the face is affected. Rarely, Bell palsy affects the whole face. How is this diagnosed? This condition is diagnosed based on:  Your symptoms.  Your medical history.  A physical exam.  You may also have to see health care providers who specialize in disorders of the nerves (neurologist) or diseases and conditions of the eye (ophthalmologist). You may have tests, such as:  A test to check for nerve damage (electromyogram).  Imaging  studies, such as CT or MRI scans.  Blood tests.  How is this treated? This condition affects every person differently. Sometimes symptoms go away without treatment within a couple weeks. If treatment is needed, it varies from person to person. The goal of treatment is to reduce inflammation and protect the eye from damage. Treatment for Bell palsy may include:  Medicines, such as: ? Steroids to reduce swelling and inflammation. ? Antiviral drugs. ? Pain relievers, including aspirin, acetaminophen, or ibuprofen.  Eye drops or ointment to keep your eye moist.  Eye protection, if you cannot close your eye.  Exercises or massage to regain muscle strength and function (physical therapy).  Follow these instructions at home:  Take over-the-counter and prescription medicines only as told by your health care provider.  If your eye is affected: ? Keep your eye moist with eye drops or ointment as told by your health care provider. ? Follow instructions for eye care and protection as told by your health care provider.  Do any physical therapy exercises as told by your health care provider.  Keep all follow-up visits as told by your health care provider. This is important. Contact a health care provider if:  You have a fever.  Your symptoms do not get better within 2-3 weeks, or your symptoms get worse.  Your eye is red, irritated, or painful.  You have new symptoms. Get help right away if:  You have weakness or numbness in a part of your body other than your face.    You have trouble swallowing.  You develop neck pain or stiffness.  You develop dizziness or shortness of breath. Summary  Bell palsy is a short-term inability to move muscles in part of the face. The inability to move (paralysis) results from inflammation or compression of the facial nerve.  This condition affects every person differently. Sometimes symptoms go away without treatment within a couple weeks.  If  treatment is needed, it varies from person to person. The goal of treatment is to reduce inflammation and protect the eye from damage.  Contact your health care provider if your symptoms do not get better within 2-3 weeks, or your symptoms get worse. This information is not intended to replace advice given to you by your health care provider. Make sure you discuss any questions you have with your health care provider. Document Released: 05/08/2005 Document Revised: 07/11/2016 Document Reviewed: 07/11/2016 Elsevier Interactive Patient Education  2018 [employer]  

## 2018-04-01 NOTE — Progress Notes (Signed)
Patient: Benjamin Oliver Male    DOB: 05-Mar-1966   52 y.o.   MRN: [id number] Visit Date: 04/01/2018  Today's Provider: Vernie Murders, PA   Chief Complaint  Patient presents with  . paralysis on right side of face    started on friday 03/29/18   Subjective:    HPI  Pt reports he is having paralysis on right side of face that started Friday 03/29/18.  He has also had a sore throat for a few days and right ear stuffiness prior to the paralysis.  He also states its hard to breathe out of right side of nose, he can't eat right, talk right and other issues.  Pt states he is having a lot of stress and is starting a new job on Tuesday and is hoping this can be corrected by then.    History reviewed. No pertinent past medical history. Patient Active Problem List   Diagnosis Date Noted  . Allergic rhinitis 09/11/2017   . Past Surgical History:  Procedure Laterality Date  . LEG SURGERY  1984   Family History  Problem Relation Age of Onset  . Cancer Brother        lung cancer   No Known Allergies  Current Outpatient Medications:  .  levothyroxine (SYNTHROID, LEVOTHROID) 50 MCG tablet, Take 1 tablet (50 mcg total) by mouth daily., Disp: 90 tablet, Rfl: 3 .  loratadine (CLARITIN) 10 MG tablet, Take 10 mg by mouth daily., Disp: , Rfl:  .  nicotine (NICODERM CQ) 7 mg/24hr patch, Place single new patch on arm daily for weeks 9-10, Disp: 14 patch, Rfl: 0 .  predniSONE (DELTASONE) 5 MG tablet, Take 1 tablet (5 mg total) by mouth daily with breakfast. Taper down every 2 days starting with 6 tablets (6,6,5,5,4,4,3,3,2,2,1,1)., Disp: 42 tablet, Rfl: 0  Review of Systems  Constitutional: Negative.   HENT: Positive for ear pain (right side), sinus pressure, sinus pain and sore throat (before onset of paralysis). Negative for congestion, dental problem, drooling, ear discharge, facial swelling, hearing loss, mouth sores, nosebleeds, postnasal drip, rhinorrhea, sneezing, tinnitus, trouble  swallowing and voice change.   Eyes: Negative.   Respiratory: Negative.   Cardiovascular: Negative.   Gastrointestinal: Negative.   Endocrine: Negative.   Genitourinary: Negative.   Musculoskeletal: Negative.   Skin: Negative.   Allergic/Immunologic: Negative.   Neurological: Positive for facial asymmetry (paralysis of right side of face), speech difficulty, numbness and headaches. Negative for tremors, seizures, syncope, weakness and light-headedness.  Hematological: Negative.   Psychiatric/Behavioral: Negative.     Social History   Tobacco Use  . Smoking status: Current Every Day Smoker    Packs/day: 1.00    Types: Cigarettes  . Smokeless tobacco: Never Used  Substance Use Topics  . Alcohol use: Yes    Comment: occassionally   Objective:   BP (!) 140/98 (BP Location: Right Arm, Patient Position: Sitting, Cuff Size: Normal)   Pulse 84   Temp 98.4 F (36.9 C) (Oral)   Ht 6' (1.829 m)   Wt 229 lb (103.9 kg)   SpO2 97%   BMI 31.06 kg/m  Vitals:   04/01/18 0836  BP: (!) 140/98  Pulse: 84  Temp: 98.4 F (36.9 C)  TempSrc: Oral  SpO2: 97%  Weight: 229 lb (103.9 kg)  Height: 6' (1.829 m)     Physical Exam  Constitutional: He is oriented to person, place, and time. He appears well-developed and well-nourished. No distress.  HENT:  Head: Normocephalic and atraumatic.  Right Ear: Hearing normal.  Left Ear: Hearing normal.  Nose: Nose normal.  Eyes: Conjunctivae and lids are normal. Right eye exhibits no discharge. Left eye exhibits no discharge. No scleral icterus.  Cardiovascular: Normal rate and regular rhythm.  Pulmonary/Chest: Effort normal and breath sounds normal. No respiratory distress.  Musculoskeletal: Normal range of motion.  Neurological: He is alert and oriented to person, place, and time. He exhibits abnormal muscle tone.  Flaccid right side of face. Unable to raise the right eyebrow, close the right eye or raise the right corner of his mouth. Normal  tongue movement, good grip strength and leg strength bilaterally. DTR's symmetric and active.  Skin: Skin is intact. No lesion and no rash noted.  Psychiatric: He has a normal mood and affect. His speech is normal and behavior is normal. Thought content normal.       Assessment & Plan:     1. Bell's palsy Onset with numbness and facial weakness on the right on 03-29-18. No muscle weakness in arms or legs. History of Bell's Palsy on the left in the past. Will treat with 12 day 5 mg Prednisone taper. Tape eyelids shut at night and use Liquid Tears to lubricate the right eye prn. Recheck in 3 weeks - predniSONE (DELTASONE) 5 MG tablet; Take 1 tablet (5 mg total) by mouth daily with breakfast. Taper down every 2 days starting with 6 tablets (6,6,5,5,4,4,3,3,2,2,1,1).  Dispense: 42 tablet; Refill: 0  2. Tobacco abuse Back to smoking regularly and wants to try to start the Nicotine Patches again. Recheck in 2-3 weeks. - nicotine (NICODERM CQ) 7 mg/24hr patch; Place single new patch on arm daily for weeks 9-10  Dispense: 14 patch; Refill: 0  3. Hypothyroidism, unspecified type TSH 18.49 on 09-11-17. Complains of some erectile dysfunction. Did not start the Levothyroxine 6 months ago. No palpitation or myxedema. Will start Levothyroxine 50 mcg qd, recheck BP and lab work in 3 weeks. - levothyroxine (SYNTHROID, LEVOTHROID) 50 MCG tablet; Take 1 tablet (50 mcg total) by mouth daily.  Dispense: 90 tablet; Refill: [place], [facility]

## 2018-04-22 ENCOUNTER — Ambulatory Visit (INDEPENDENT_AMBULATORY_CARE_PROVIDER_SITE_OTHER): Payer: Self-pay | Admitting: Family Medicine

## 2018-04-22 ENCOUNTER — Encounter: Payer: Self-pay | Admitting: Family Medicine

## 2018-04-22 VITALS — BP 123/85 | HR 69 | Temp 98.0°F | Resp 16 | Wt 231.0 lb

## 2018-04-22 DIAGNOSIS — E039 Hypothyroidism, unspecified: Secondary | ICD-10-CM

## 2018-04-22 DIAGNOSIS — G510 Bell's palsy: Secondary | ICD-10-CM

## 2018-04-22 NOTE — Progress Notes (Signed)
Patient: Benjamin Oliver Male    DOB: 01-27-1966   52 y.o.   MRN: [id number] Visit Date: 04/22/2018  Today's Provider: Vernie Murders, PA   Chief Complaint  Patient presents with  . Follow-up   Subjective:    HPI  Bell's palsy From 04/01/2018- treated with 12 day 5 mg Prednisone taper. Advised to tape eyelids shut at night and use Liquid Tears to lubricate the right eye prn. Recheck in 3 weeks  Hypothyroidism, unspecified type From 04/01/2018-started Levothyroxine 50 mcg qd, recheck BP and lab work in 3 weeks.  History reviewed. No pertinent past medical history. Patient Active Problem List   Diagnosis Date Noted  . Allergic rhinitis 09/11/2017   Family History  Problem Relation Age of Onset  . Cancer Brother        lung cancer   Past Surgical History:  Procedure Laterality Date  . LEG SURGERY  1984   No Known Allergies  Current Outpatient Medications:  .  levothyroxine (SYNTHROID, LEVOTHROID) 50 MCG tablet, Take 1 tablet (50 mcg total) by mouth daily., Disp: 90 tablet, Rfl: 3 .  loratadine (CLARITIN) 10 MG tablet, Take 10 mg by mouth daily., Disp: , Rfl:  .  nicotine (NICODERM CQ) 7 mg/24hr patch, Place single new patch on arm daily for weeks 9-10, Disp: 14 patch, Rfl: 0 .  predniSONE (DELTASONE) 5 MG tablet, Take 1 tablet (5 mg total) by mouth daily with breakfast. Taper down every 2 days starting with 6 tablets (6,6,5,5,4,4,3,3,2,2,1,1). (Patient not taking: Reported on 04/22/2018), Disp: 42 tablet, Rfl: 0  Review of Systems  Constitutional: Negative for appetite change, chills and fever.  Respiratory: Negative for chest tightness, shortness of breath and wheezing.   Cardiovascular: Negative for chest pain and palpitations.  Gastrointestinal: Negative for abdominal pain, nausea and vomiting.   Social History   Tobacco Use  . Smoking status: Current Every Day Smoker    Packs/day: 1.00    Types: Cigarettes  . Smokeless tobacco: Never Used  Substance Use  Topics  . Alcohol use: Yes    Comment: occassionally   Objective:   BP 123/85 (BP Location: Right Arm, Patient Position: Sitting, Cuff Size: Large)   Pulse 69   Temp 98 F (36.7 C) (Oral)   Resp 16   Wt 231 lb (104.8 kg)   SpO2 96%   BMI 31.33 kg/m   BP Readings from Last 3 Encounters:  04/22/18 123/85  04/01/18 (!) 140/98  10/29/17 110/68   Vitals:   04/22/18 1521  BP: 123/85  Pulse: 69  Resp: 16  Temp: 98 F (36.7 C)  TempSrc: Oral  SpO2: 96%  Weight: 231 lb (104.8 kg)   Physical Exam  Constitutional: He is oriented to person, place, and time. He appears well-developed and well-nourished. No distress.  HENT:  Head: Normocephalic and atraumatic.  Right Ear: Hearing normal.  Left Ear: Hearing normal.  Nose: Nose normal.  Eyes: Conjunctivae and lids are normal. Right eye exhibits no discharge. Left eye exhibits no discharge. No scleral icterus.  Pulmonary/Chest: Effort normal. No respiratory distress.  Musculoskeletal: Normal range of motion.  Neurological: He is alert and oriented to person, place, and time. He exhibits abnormal muscle tone.  Weakness in right side of face. Better able to close the right eye. No irritation to the eye noted. Able to move tongue with good strength through out.  Skin: Skin is intact. No lesion and no rash noted.  Psychiatric: He has a  normal mood and affect. His speech is normal and behavior is normal. Thought content normal.      Assessment & Plan:     1. Bell's palsy Very slow change. Able to close the right eye better and no longer using tape or eye patch at night. Finished the prednisone taper. Follow up in 4-5 weeks. May need referral to neurologist if no better (waiting for new insurance to be in effect).  2. Hypothyroidism, unspecified type Tolerating the Levothyroxine 50 mcg qd. Recheck labs in 4-5 weeks.       Vernie Murders, PA  [facility]

## 2018-05-30 NOTE — Progress Notes (Signed)
Patient: Benjamin Oliver Male    DOB: Feb 19, 1966   53 y.o.   MRN: [id number] Visit Date: 05/31/2018  Today's Provider: Vernie Murders, PA   Chief Complaint  Patient presents with  . Follow-up    Bell's Palsy and thyroid   Subjective:     HPI Patient comes in today for 4-5 weeks follow-up Bell's Palsy. Bell's palsy: Patient reports is a little better. He is able to close his eye but is not able to wink. Still affecting his speech and how food taste.   Hypothyroidism, unspecified type, follow-up: Tolerating the Levothyroxine 50 mcg qd.  TSH  Date Value Ref Range Status  09/11/2017 18.490 (H) 0.450 - 4.500 uIU/mL Final   He reports excellent compliance with treatment. He is not having side effects.  He is exercising. He is experiencing none He denies change in energy level, diarrhea, heat / cold intolerance, nervousness, palpitations and weight changes ------------------------------------------------------------------------ History reviewed. No pertinent past medical history. Patient Active Problem List   Diagnosis Date Noted  . Allergic rhinitis 09/11/2017   Past Surgical History:  Procedure Laterality Date  . LEG SURGERY  1984   Family History  Problem Relation Age of Onset  . Cancer Brother        lung cancer   No Known Allergies  Current Outpatient Medications:  .  levothyroxine (SYNTHROID, LEVOTHROID) 50 MCG tablet, Take 1 tablet (50 mcg total) by mouth daily., Disp: 90 tablet, Rfl: 3 .  loratadine (CLARITIN) 10 MG tablet, Take 10 mg by mouth daily., Disp: , Rfl:  .  nicotine (NICODERM CQ) 7 mg/24hr patch, Place single new patch on arm daily for weeks 9-10 (Patient not taking: Reported on 05/31/2018), Disp: 14 patch, Rfl: 0  Review of Systems  Cardiovascular: Negative for chest pain, palpitations and leg swelling.   Social History   Tobacco Use  . Smoking status: Current Every Day Smoker    Packs/day: 1.00    Types: Cigarettes  . Smokeless tobacco:  Never Used  Substance Use Topics  . Alcohol use: Yes    Comment: occassionally     Objective:   BP 120/70 (BP Location: Left Arm, Patient Position: Sitting, Cuff Size: Large)   Pulse 78   Temp 98.1 F (36.7 C) (Oral)   Resp 16   Wt 226 lb 3.2 oz (102.6 kg)   SpO2 97%   BMI 30.68 kg/m  Vitals:   05/31/18 0838  BP: 120/70  Pulse: 78  Resp: 16  Temp: 98.1 F (36.7 C)  TempSrc: Oral  SpO2: 97%  Weight: 226 lb 3.2 oz (102.6 kg)   Physical Exam Constitutional:      General: He is not in acute distress.    Appearance: He is well-developed.  HENT:     Head: Normocephalic and atraumatic.     Right Ear: Hearing normal.     Left Ear: Hearing normal.     Nose: Nose normal.  Eyes:     General: Lids are normal. No scleral icterus.       Right eye: No discharge.        Left eye: No discharge.     Conjunctiva/sclera: Conjunctivae normal.  Neck:     Musculoskeletal: Normal range of motion and neck supple.  Cardiovascular:     Rate and Rhythm: Normal rate and regular rhythm.     Heart sounds: Normal heart sounds.  Pulmonary:     Effort: Pulmonary effort is normal. No respiratory distress.  Abdominal:     General: Bowel sounds are normal.     Palpations: Abdomen is soft.  Musculoskeletal: Normal range of motion.     Comments: Better facial muscle strength but still slight weakness. No numbness.  Skin:    Findings: No lesion or rash.  Neurological:     Mental Status: He is alert and oriented to person, place, and time.     Deep Tendon Reflexes: Reflexes normal.  Psychiatric:        Speech: Speech normal.        Behavior: Behavior normal.        Thought Content: Thought content normal.       Assessment & Plan    1. Right-sided Bell's palsy Onset early November 2019. Having better movement of the right side of mouth, cheek and ability to close the right eye. No eye irritation now and able to eat better without drooling. Still feels taste is not "right", yet. Recommended  massage and facial exercises. Warned he may feel symptoms are a little more noticeable if he is fatigued. Recheck as needed.  2. Hypothyroidism, unspecified type Tolerating Levothyroxine 50 mcg qd. No constipation, palpitations, exophthalmos, pretibial edema or tremor. Will recheck T4, TSH and CBC. Continue present dosage. - TSH - T4 - CBC with Differential/Platelet  3. Need for influenza vaccination - Flu Vaccine QUAD 36+ mos IM     Vernie Murders, PA  [facility]

## 2018-05-31 ENCOUNTER — Encounter: Payer: Self-pay | Admitting: Family Medicine

## 2018-05-31 ENCOUNTER — Ambulatory Visit: Payer: Managed Care, Other (non HMO) | Admitting: Family Medicine

## 2018-05-31 VITALS — BP 120/70 | HR 78 | Temp 98.1°F | Resp 16 | Wt 226.2 lb

## 2018-05-31 DIAGNOSIS — E039 Hypothyroidism, unspecified: Secondary | ICD-10-CM

## 2018-05-31 DIAGNOSIS — G510 Bell's palsy: Secondary | ICD-10-CM

## 2018-05-31 DIAGNOSIS — Z23 Encounter for immunization: Secondary | ICD-10-CM

## 2018-06-01 LAB — CBC WITH DIFFERENTIAL/PLATELET
BASOS: 1 %
Basophils Absolute: 0.1 10*3/uL (ref 0.0–0.2)
EOS (ABSOLUTE): 0.3 10*3/uL (ref 0.0–0.4)
EOS: 3 %
HEMOGLOBIN: 15.6 g/dL (ref 13.0–17.7)
Hematocrit: 44.7 % (ref 37.5–51.0)
IMMATURE GRANS (ABS): 0 10*3/uL (ref 0.0–0.1)
IMMATURE GRANULOCYTES: 0 %
LYMPHS: 44 %
Lymphocytes Absolute: 4.1 10*3/uL — ABNORMAL HIGH (ref 0.7–3.1)
MCH: 31.3 pg (ref 26.6–33.0)
MCHC: 34.9 g/dL (ref 31.5–35.7)
MCV: 90 fL (ref 79–97)
MONOCYTES: 10 %
Monocytes Absolute: 0.9 10*3/uL (ref 0.1–0.9)
NEUTROS ABS: 3.9 10*3/uL (ref 1.4–7.0)
Neutrophils: 42 %
Platelets: 283 10*3/uL (ref 150–450)
RBC: 4.98 x10E6/uL (ref 4.14–5.80)
RDW: 12.6 % (ref 11.6–15.4)
WBC: 9.4 10*3/uL (ref 3.4–10.8)

## 2018-06-01 LAB — T4: T4, Total: 6.4 ug/dL (ref 4.5–12.0)

## 2018-06-01 LAB — TSH: TSH: 9.6 u[IU]/mL — ABNORMAL HIGH (ref 0.450–4.500)

## 2018-06-03 ENCOUNTER — Telehealth: Payer: Self-pay

## 2018-06-03 DIAGNOSIS — E039 Hypothyroidism, unspecified: Secondary | ICD-10-CM

## 2018-06-03 NOTE — Telephone Encounter (Signed)
-----   Message from Margo Common, [place] sent at 06/03/2018  8:46 AM EST ----- TSH better but still high. Could use a little more Levothyroxine Go up to 75 mcg (1.5 tablets) daily. Recheck blood levels in 6 weeks.

## 2018-06-03 NOTE — Telephone Encounter (Signed)
LMTCB

## 2018-06-04 NOTE — Telephone Encounter (Signed)
LMTCB

## 2018-06-05 MED ORDER — LEVOTHYROXINE SODIUM 75 MCG PO TABS: 75.0000 ug | ORAL_TABLET | Freq: Every day | ORAL | 12 refills | Status: DC

## 2018-06-05 NOTE — Telephone Encounter (Signed)
Patient advised and Rx sent to pharmacy.  Patient states pills are very small and would prefer this to cutting them in half.

## 2019-07-20 ENCOUNTER — Other Ambulatory Visit: Payer: Self-pay | Admitting: Family Medicine

## 2019-07-20 DIAGNOSIS — E039 Hypothyroidism, unspecified: Secondary | ICD-10-CM

## 2020-01-30 ENCOUNTER — Ambulatory Visit: Payer: Self-pay

## 2020-01-30 NOTE — Telephone Encounter (Signed)
FYI. Thanks.

## 2020-01-30 NOTE — Telephone Encounter (Signed)
Patient called stating that he has had a numb rt leg. 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 He pulled hard and felt his rt leg tingle and go numb.  He states the numbness starts in his groin and goes to his toes. He states that there is no swelling but the leg feels bigger "like shot full of novocain". No cuts scrapes or bruises. He has no back pain or discomfort. He is able to walk, work and drive. He states today his rt side is now feeling "funny". Per protocol patient was asked to go to UC for evaluation today but he has refused. He states he will keep appointment he already had for next Tuesday. Office was notified. No other appointment were availiable. Ok to keep appointment. Patient was again urged to seek care today. He verbalized understanding but states he will wait for appointment date.  Reason for Disposition . Suspicious history for the injury  Answer Assessment - Initial Assessment Questions 1. MECHANISM: "How did the injury happen?" (e.g., twisting injury, direct blow)      Twist pull 2. ONSET: "When did the injury happen?" (Minutes or hours ago)      Last friday 3. LOCATION: "Where is the injury located?"      Rt leg 4. APPEARANCE of INJURY: "What does the injury look like?"  (e.g., deformity of leg)     Nothing just feels heavy and numb 5. SEVERITY: "Can you put weight on that leg?" "Can you walk?"      Able to walk 6. SIZE: For cuts, bruises, or swelling, ask: "How large is it?" (e.g., inches or centimeters)     N/A 7. PAIN: "Is there pain?" If Yes, ask: "How bad is the pain?"  (Scale 1-10; or mild, moderate, severe)    Pain in heels but has had this for months 8. TETANUS: For any breaks in the skin, ask: "When was the last tetanus booster?"     N/A 9. OTHER SYMPTOMS: "Do you have any other symptoms?"      Numb in groin  Rt buttock  10. PREGNANCY: "Is there any chance you are pregnant?" "When was your last menstrual period?"        N/A  Protocols used: LEG INJURY-A-AH

## 2020-02-02 NOTE — Progress Notes (Signed)
Established patient visit   Patient: Benjamin Oliver   DOB: 11/14/1965   54 y.o. Male  MRN: [id number] Visit Date: 02/03/2020  Today's healthcare provider: Trinna Post, PA-C   Chief Complaint  Patient presents with  . Leg Pain  I,Porsha C McClurkin,acting as a scribe for Trinna Post, PA-C.,have documented all relevant documentation on the behalf of Trinna Post, PA-C,as directed by  Trinna Post, PA-C while in the presence of Trinna Post, PA-C.  Subjective    Leg Pain  The incident occurred more than 1 week ago. There was no injury mechanism. The pain is present in the left leg and right leg. The pain has been constant since onset. Associated symptoms include numbness and tingling. Pertinent negatives include no inability to bear weight, loss of motion, loss of sensation or muscle weakness. He reports no foreign bodies present. The symptoms are aggravated by movement and weight bearing.    Reports he hasn't been able to feel his right leg for one week. He reports the other day he got his right foot stuck on industrial glue puddle and he pulled on it very hard and he reports after this his leg felt weird. He reports the next day he had inguinal and buttocks tightness. He now reports he has right sided numbness on his abdomen. He reports he feels a tightening sensation around his groin on both sides. He reports numbness and tingling down the bottom of his right leg into his foot. He reports he won't be able to feel his cell phone in his right leg. He is able to walk without issue but reports a strange sensation. Now he reports his left leg is also going numb. He report his rectum is numb as well. He denies incontinence. He denies numbness or tingling in his hands and arms. 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 He denies trouble speaking. He denies headache or vision change.   Hypothyroid,  follow-up  Lab Results  Component Value Date   TSH 9.600 (H) 05/31/2018   TSH 18.490 (H) 09/11/2017   FREET4 0.74 (L) 09/11/2017   T4TOTAL 6.4 05/31/2018   Wt Readings from Last 3 Encounters:  02/03/20 236 lb 6.4 oz (107.2 kg)  05/31/18 226 lb 3.2 oz (102.6 kg)  04/22/18 231 lb (104.8 kg)    He was last seen for hypothyroid  2  years ago.  Management since that visit includes synthroid 75 mcg daily. He reports poor compliance with treatment. Hasn't taken thyroid medication in 2 months. He is not having side effects.   Symptoms: No change in energy level No constipation  No diarrhea No heat / cold intolerance  No nervousness No palpitations  No weight changes    -----------------------------------------------------------------------------------------     Medications: Outpatient Medications Prior to Visit  Medication Sig  . loratadine (CLARITIN) 10 MG tablet Take 10 mg by mouth daily.  Marland Kitchen levothyroxine (SYNTHROID, LEVOTHROID) 75 MCG tablet Take 1 tablet (75 mcg total) by mouth daily. (Patient not taking: Reported on 02/03/2020)  . nicotine (NICODERM CQ) 7 mg/24hr patch Place single new patch on arm daily for weeks 9-10 (Patient not taking: Reported on 05/31/2018)   No facility-administered medications prior to visit.    Review of Systems  Constitutional: Negative.   Respiratory: Negative.   Cardiovascular: Negative for leg swelling.  Neurological: Positive for tingling and numbness.  Objective    BP 135/90 (BP Location: Left Arm, Patient Position: Sitting, Cuff Size: Normal)   Pulse 66   Temp 98.7 F (37.1 C) (Oral)   Wt 236 lb 6.4 oz (107.2 kg)   SpO2 97%   BMI 32.06 kg/m    Physical Exam Constitutional:      Appearance: Normal appearance.  Eyes:     Extraocular Movements: Extraocular movements intact.     Pupils: Pupils are equal, round, and reactive to light.  Cardiovascular:     Rate and Rhythm: Normal rate and regular rhythm.     Pulses: Normal  pulses.     Heart sounds: Normal heart sounds.  Pulmonary:     Effort: Pulmonary effort is normal.     Breath sounds: Normal breath sounds.  Skin:    General: Skin is warm and dry.  Neurological:     General: No focal deficit present.     Mental Status: He is alert and oriented to person, place, and time.     GCS: GCS eye subscore is 4. GCS verbal subscore is 5. GCS motor subscore is 6.     Cranial Nerves: No cranial nerve deficit.     Sensory: Sensory deficit present.     Motor: No weakness.     Comments: Sensory deficit in right leg. 5/5 strength in bilateral upper and lower extremities.   Psychiatric:        Mood and Affect: Mood normal.        Behavior: Behavior normal.       No results found for any visits on 02/03/20.  Assessment & Plan    1. Annual physical exam  - HIV Antibody (routine testing w rflx) - TSH - Lipid panel - Comprehensive metabolic panel - CBC with Differential/Platelet - Hepatitis C Antibody  2. Right leg numbness  Patient categorically declines going to the ER. I have counseled him about the risks of delaying this imaging. Patient expresses this understanding. Will get MRI lumbar Spine stat to r/o cauda equina syndrome.  - cyclobenzaprine (FLEXERIL) 10 MG tablet; Take 1 tablet (10 mg total) by mouth 3 (three) times daily as needed for muscle spasms.  Dispense: 30 tablet; Refill: 0 - meloxicam (MOBIC) 7.5 MG tablet; Take 1 tablet (7.5 mg total) by mouth daily.  Dispense: 30 tablet; Refill: 0 - MR Lumbar Spine Wo Contrast  3. Acquired hypothyroidism   4. Colon cancer screening  - Ambulatory referral to Gastroenterology  5. Right sided numbness  - meloxicam (MOBIC) 7.5 MG tablet; Take 1 tablet (7.5 mg total) by mouth daily.  Dispense: 30 tablet; Refill: 0 - MR Lumbar Spine Wo Contrast  6. Weakness  - MR Lumbar Spine Wo Contrast    No follow-ups on file.      ITrinna Post, PA-C, have reviewed all documentation for this visit.  The documentation on 02/03/20 for the exam, diagnosis, procedures, and orders are all accurate and complete.  The entirety of the information documented in the History of Present Illness, Review of Systems and Physical Exam were personally obtained by me. Portions of this information were initially documented by [facility] and reviewed by me for thoroughness and accuracy.     Paulene Floor  [facility] [phone] (phone) [phone] (fax)  [place]

## 2020-02-03 ENCOUNTER — Ambulatory Visit: Payer: Managed Care, Other (non HMO) | Admitting: Physician Assistant

## 2020-02-03 ENCOUNTER — Encounter: Payer: Self-pay | Admitting: Physician Assistant

## 2020-02-03 ENCOUNTER — Other Ambulatory Visit: Payer: Self-pay

## 2020-02-03 VITALS — BP 135/90 | HR 66 | Temp 98.7°F | Wt 236.4 lb

## 2020-02-03 DIAGNOSIS — Z1211 Encounter for screening for malignant neoplasm of colon: Secondary | ICD-10-CM | POA: Diagnosis not present

## 2020-02-03 DIAGNOSIS — F411 Generalized anxiety disorder: Secondary | ICD-10-CM | POA: Diagnosis not present

## 2020-02-03 DIAGNOSIS — R531 Weakness: Secondary | ICD-10-CM

## 2020-02-03 DIAGNOSIS — Z0000 Encounter for general adult medical examination without abnormal findings: Secondary | ICD-10-CM

## 2020-02-03 DIAGNOSIS — R200 Anesthesia of skin: Secondary | ICD-10-CM | POA: Diagnosis not present

## 2020-02-03 DIAGNOSIS — F419 Anxiety disorder, unspecified: Secondary | ICD-10-CM

## 2020-02-03 DIAGNOSIS — E039 Hypothyroidism, unspecified: Secondary | ICD-10-CM | POA: Diagnosis not present

## 2020-02-03 MED ORDER — MELOXICAM 7.5 MG PO TABS: 7.5000 mg | ORAL_TABLET | Freq: Every day | ORAL | 0 refills | Status: DC

## 2020-02-03 MED ORDER — DIAZEPAM 5 MG PO TABS: ORAL_TABLET | ORAL | 0 refills | Status: DC

## 2020-02-03 MED ORDER — CYCLOBENZAPRINE HCL 10 MG PO TABS: 10.0000 mg | ORAL_TABLET | Freq: Three times a day (TID) | ORAL | 0 refills | Status: DC | PRN

## 2020-02-03 NOTE — Addendum Note (Signed)
Addended by: Trinna Post on: 02/03/2020 05:12 PM   Modules accepted: Orders

## 2020-02-04 ENCOUNTER — Ambulatory Visit
Admission: RE | Admit: 2020-02-04 | Discharge: 2020-02-04 | Disposition: A | Discharge status: Home / Self Care | Payer: Managed Care, Other (non HMO) | Source: Ambulatory Visit | Attending: Physician Assistant | Admitting: Physician Assistant

## 2020-02-04 ENCOUNTER — Telehealth: Payer: Self-pay

## 2020-02-04 ENCOUNTER — Other Ambulatory Visit: Payer: Self-pay

## 2020-02-04 DIAGNOSIS — R531 Weakness: Secondary | ICD-10-CM | POA: Diagnosis present

## 2020-02-04 DIAGNOSIS — R200 Anesthesia of skin: Secondary | ICD-10-CM | POA: Insufficient documentation

## 2020-02-04 IMAGING — MR MR LUMBAR SPINE W/O CM
5 series · 31 of 48 positions shown · non-contrast
Comparison: None.

CLINICAL DATA: Low back pain. Right leg weakness and numbness.
Rectal numbness. Rule out cauda equina syndrome.

EXAM:
MRI LUMBAR SPINE WITHOUT CONTRAST
TECHNIQUE: Multiplanar, multisequence MR imaging of the lumbar spine was
performed. No intravenous contrast was administered.

[Series 5: T2 · sagittal · 4.0mm · 0.81mm/px · 6 of 17 slices shown (1 of 2)]
[im 1/17]
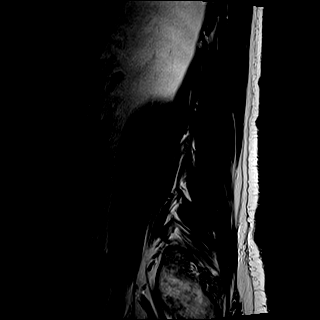
[im 4/17]
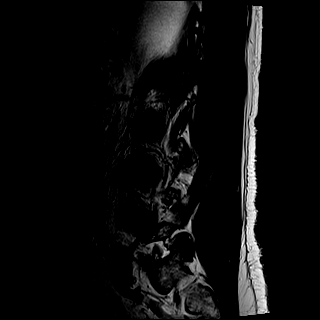
[im 7/17]
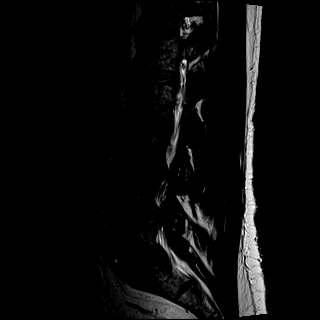
[im 10/17]
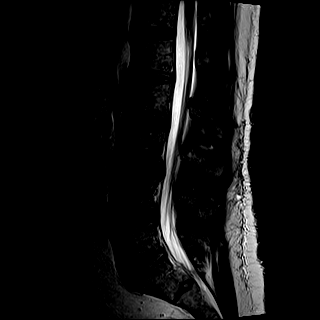
[im 13/17]
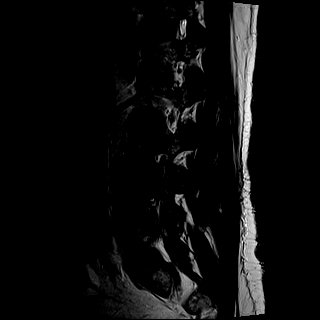
[im 17/17]
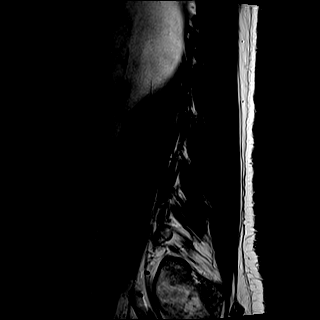

[Series 6: T1 · sagittal · 4.0mm · 0.81mm/px · 6 of 17 slices shown (1 of 2)]
[im 1/17]
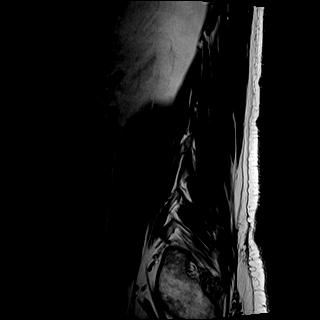
[im 4/17]
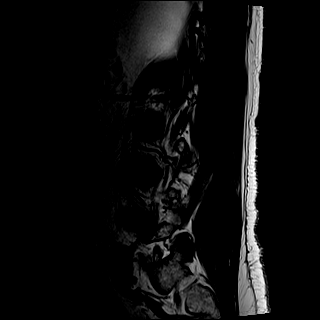
[im 7/17]
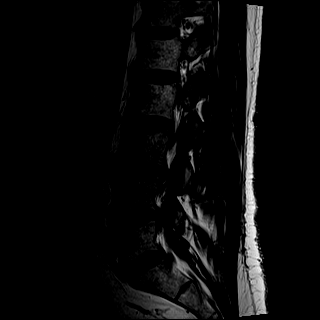
[im 10/17]
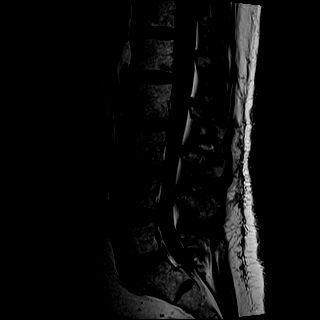
[im 13/17]
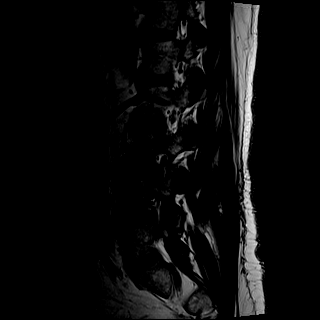
[im 17/17]
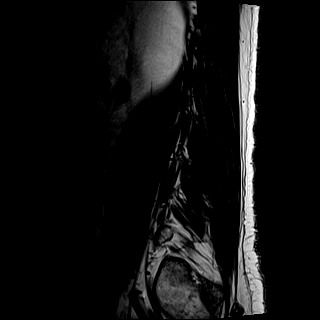

[Series 7: STIR · sagittal · 4.0mm · 0.41mm/px · 1 of 17 slices shown]
[im 1/17]
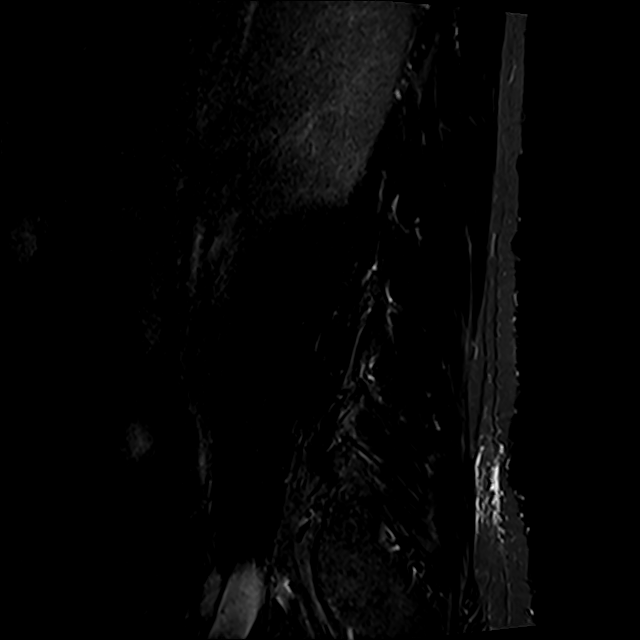

[Series 8: T2 · axial · 4.0mm · 0.78mm/px · z∈[-51,+171]mm · 9 of 39 slices shown (2 of 2)]
[im 1/39]
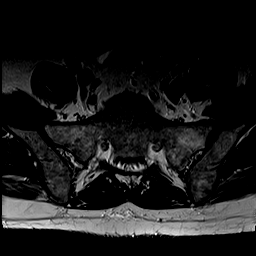
[im 6/39]
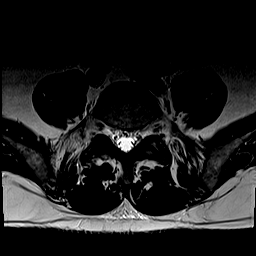
[im 11/39]
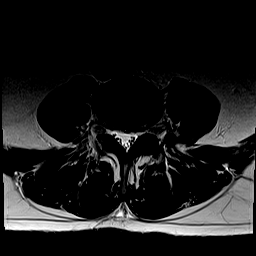
[im 17/39]
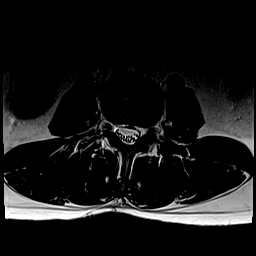
[im 20/39]
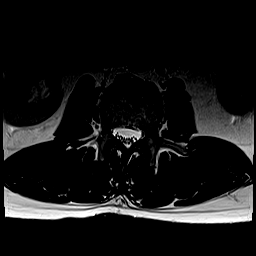
[im 22/39]
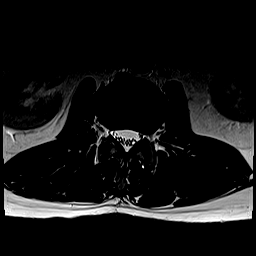
[im 28/39]
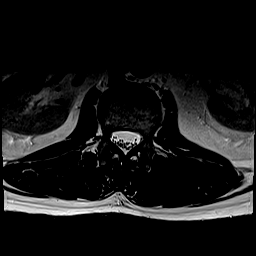
[im 33/39]
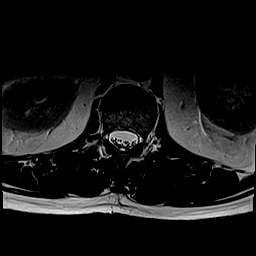
[im 39/39]
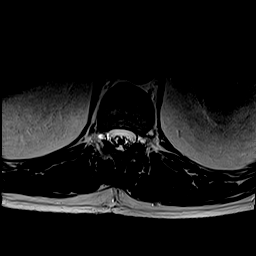

[Series 9: T1 · axial · 4.0mm · 0.39mm/px · z∈[-51,+171]mm · 9 of 39 slices shown (2 of 2)]
[im 1/39]
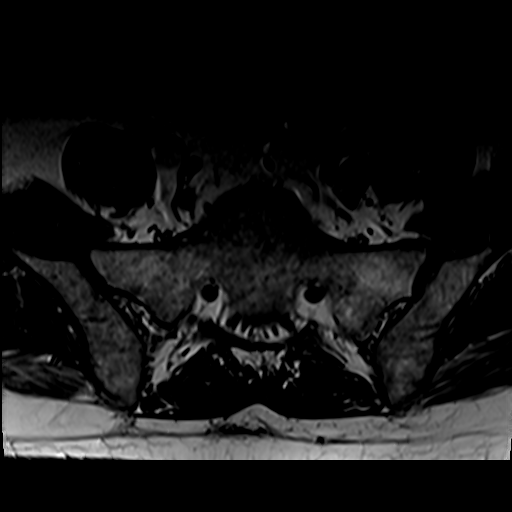
[im 6/39]
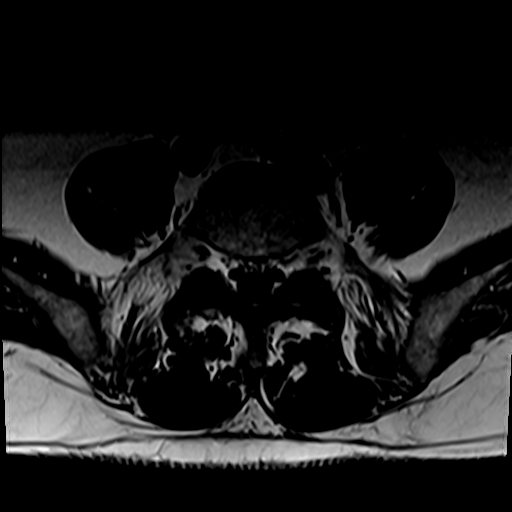
[im 11/39]
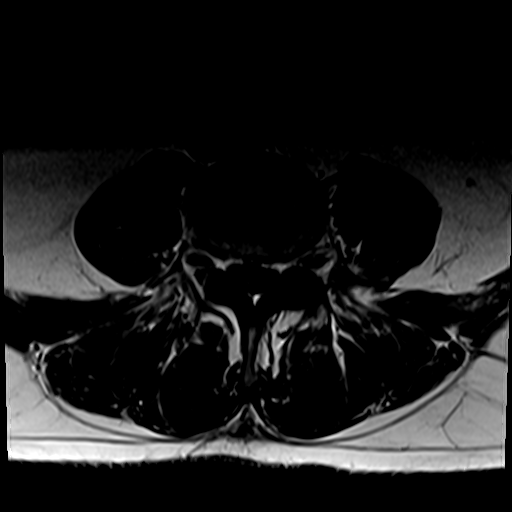
[im 17/39]
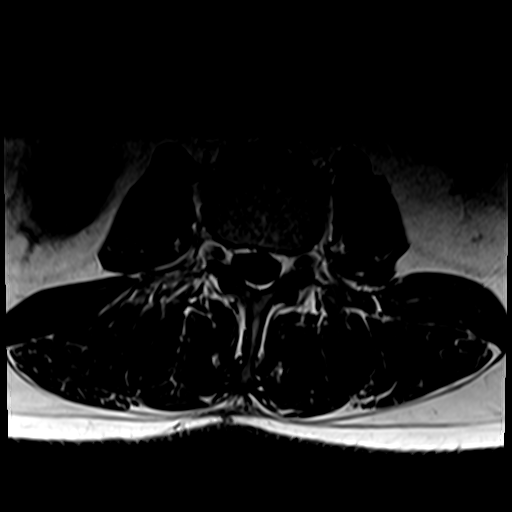
[im 20/39]
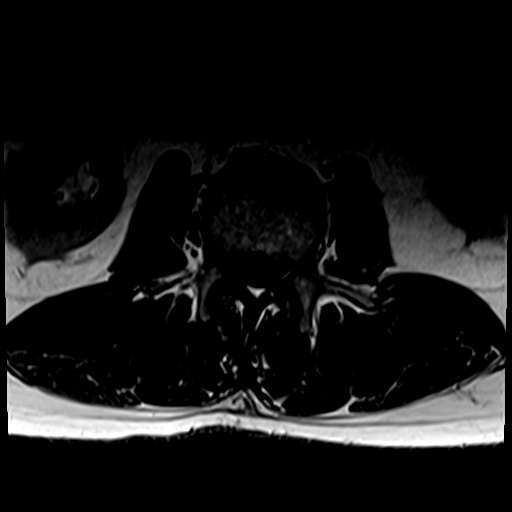
[im 22/39]
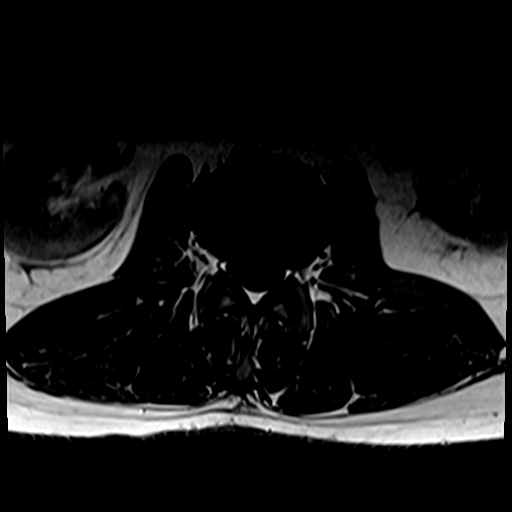
[im 28/39]
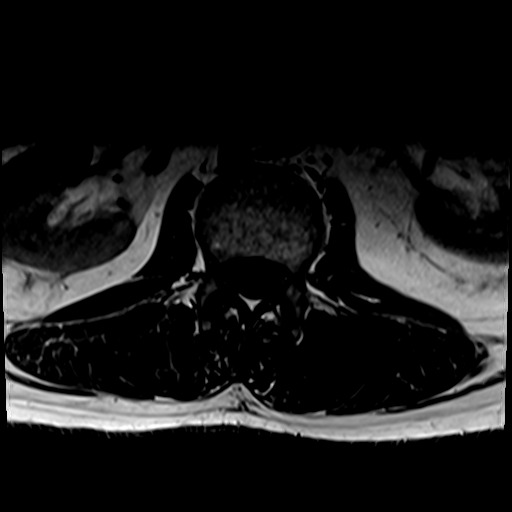
[im 33/39]
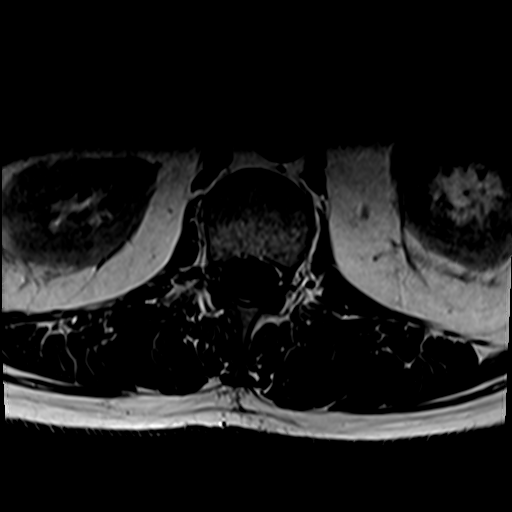
[im 39/39]
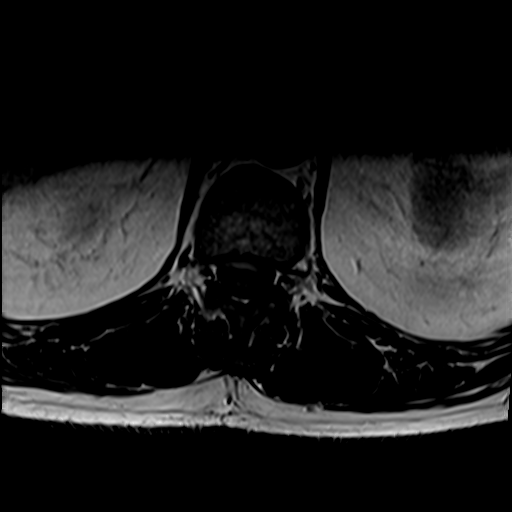

[31 of 48 positions shown; findings below may reference images not displayed]

FINDINGS: Segmentation:  Normal

Alignment:  Mild retrolisthesis L3-4.

Vertebrae:  Normal bone marrow.  Negative for fracture or mass.

Conus medullaris and cauda equina: Conus extends to the T12-L1
level. Conus and cauda equina appear normal.

Paraspinal and other soft tissues: Negative for paraspinous mass or
adenopathy. Abdominal aorta normal in caliber.

Disc levels:

L1-2: Negative

L2-3: Negative

L3-4: Broad-based central and bilateral disc protrusion slightly
asymmetric to the left. Bilateral facet hypertrophy. Mild spinal
stenosis. Moderate subarticular stenosis on the left with left L4
nerve root impingement. Mild subarticular stenosis on the right

L4-5: Shallow broad-based disc protrusion extending to the left.
There is associated endplate spurring on the left. Mild to moderate
subarticular stenosis on the left. Spinal canal adequate in size

L5-S1: Negative
IMPRESSION: Mild spinal stenosis L3-4. Broad-based disc protrusion extending to
the left with left subarticular stenosis. Probable left L4 nerve
root impingement

Central and left-sided disc protrusion at L4-5 with associated
spurring. Mild to moderate left subarticular stenosis.

## 2020-02-04 NOTE — Telephone Encounter (Signed)
Copied from [place] [id number]. Topic: General - Other >> Feb 04, 2020 11:10 AM Benjamin Oliver A wrote: Patient called to inform Terrilee Croak that his MRI has been completed and patient wants to know what Terrilee Croak wants to do next. Please advise

## 2020-02-04 NOTE — Telephone Encounter (Signed)
Personally called patient, see result note.

## 2020-02-04 NOTE — Addendum Note (Signed)
Addended by: Trinna Post on: 02/04/2020 11:20 AM   Modules accepted: Orders

## 2020-02-05 ENCOUNTER — Ambulatory Visit: Payer: Self-pay

## 2020-02-09 ENCOUNTER — Other Ambulatory Visit: Payer: Self-pay | Admitting: Neurosurgery

## 2020-02-09 DIAGNOSIS — M4714 Other spondylosis with myelopathy, thoracic region: Secondary | ICD-10-CM
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
## 2020-02-10 ENCOUNTER — Ambulatory Visit (INDEPENDENT_AMBULATORY_CARE_PROVIDER_SITE_OTHER): Payer: Managed Care, Other (non HMO)

## 2020-02-10 ENCOUNTER — Other Ambulatory Visit: Payer: Self-pay

## 2020-02-10 ENCOUNTER — Ambulatory Visit: Payer: Managed Care, Other (non HMO) | Admitting: Physician Assistant

## 2020-02-10 ENCOUNTER — Ambulatory Visit: Payer: Managed Care, Other (non HMO) | Admitting: Podiatry

## 2020-02-10 ENCOUNTER — Telehealth: Payer: Self-pay | Admitting: Physician Assistant

## 2020-02-10 ENCOUNTER — Encounter: Payer: Self-pay | Admitting: Physician Assistant

## 2020-02-10 VITALS — BP 119/89 | HR 77 | Temp 97.8°F | Wt 232.4 lb

## 2020-02-10 DIAGNOSIS — M722 Plantar fascial fibromatosis: Secondary | ICD-10-CM | POA: Diagnosis not present

## 2020-02-10 DIAGNOSIS — M545 Low back pain, unspecified: Secondary | ICD-10-CM

## 2020-02-10 DIAGNOSIS — M4714 Other spondylosis with myelopathy, thoracic region: Secondary | ICD-10-CM | POA: Diagnosis not present

## 2020-02-10 DIAGNOSIS — E039 Hypothyroidism, unspecified: Secondary | ICD-10-CM | POA: Diagnosis not present

## 2020-02-10 DIAGNOSIS — M79642 Pain in left hand: Secondary | ICD-10-CM

## 2020-02-10 MED ORDER — MELOXICAM 15 MG PO TABS: 15.0000 mg | ORAL_TABLET | Freq: Every day | ORAL | 1 refills | Status: DC

## 2020-02-10 MED ORDER — METHYLPREDNISOLONE 4 MG PO TBPK: ORAL_TABLET | ORAL | 0 refills | Status: DC

## 2020-02-10 NOTE — Telephone Encounter (Signed)
Can we let patient know his surgeon responded that he should be out of work until the MRI. Can let patient know he can file his paperwork with HR, may need FMLA and he can drop it off here or fax it and I will write it until his MRI.

## 2020-02-10 NOTE — Telephone Encounter (Signed)
Patient stated he is returning a call to the nurse or doctor regarding him returning to work.  Stated that he needs some documentation to give to his employer.  Please call to discuss at [phone]

## 2020-02-10 NOTE — Telephone Encounter (Signed)
Called patient and left a voicemail message to return call, if patient calls back okay for PEC to advise patient of message.

## 2020-02-10 NOTE — Telephone Encounter (Signed)
Per DPR, left detailed message as per Carles Collet below and to call the office if needing additional information.    Trinna Post, PA-C 2 hours ago (1:03 PM)     Can we let patient know his surgeon responded that he should be out of work until the MRI. Can let patient know he can file his paperwork with HR, may need FMLA and he can drop it off here or fax it and I will write it until his MRI.

## 2020-02-10 NOTE — Progress Notes (Signed)
° °  Subjective: 54 y.o. male presenting today as a new patient for evaluation of bilateral heel pain is been going on for several months now.  Patient denies injury.  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  He has not done anything for treatment other than OTC insoles which provide minimal to no relief.  He presents for further treatment evaluation   No past medical history on file.   Objective: Physical Exam General: The patient is alert and oriented x3 in no acute distress.  Dermatology: Skin is warm, dry and supple bilateral lower extremities. Negative for open lesions or macerations bilateral.   Vascular: Dorsalis Pedis and Posterior Tibial pulses palpable bilateral.  Capillary fill time is immediate to all digits.  Neurological: Epicritic and protective threshold intact bilateral.   Musculoskeletal: Tenderness to palpation to the plantar aspect of the bilateral heels along the plantar fascia. All other joints range of motion within normal limits bilateral. Strength 5/5 in all groups bilateral.   Radiographic exam: Normal osseous mineralization. Joint spaces preserved. No fracture/dislocation/boney destruction. No other soft tissue abnormalities or radiopaque foreign bodies.   Assessment: 1. plantar fasciitis bilateral feet  Plan of Care:  1. Patient evaluated. Xrays reviewed.   2. Injection of 0.5cc Celestone soluspan injected into the bilateral heels.  3. Rx for Medrol Dose Pak placed 4. Rx for Meloxicam ordered for patient. 5. Plantar fascial band(s) dispensed for bilateral plantar fasciitis. 6. Instructed patient regarding therapies and modalities at home to alleviate symptoms.  7.  Appointment with Pedorthist for custom molded orthotics  8.  Return to clinic in 4 weeks.    Edrick Kins, DPM [facility]  Dr. Edrick Kins, DPM    [address], [place] [address]                 Office [phone]  Fax [phone]

## 2020-02-11 ENCOUNTER — Other Ambulatory Visit: Payer: Self-pay

## 2020-02-11 DIAGNOSIS — E039 Hypothyroidism, unspecified: Secondary | ICD-10-CM

## 2020-02-11 LAB — CBC WITH DIFFERENTIAL/PLATELET
Basophils Absolute: 0.1 10*3/uL (ref 0.0–0.2)
Basos: 1 %
EOS (ABSOLUTE): 0.2 10*3/uL (ref 0.0–0.4)
Eos: 2 %
Hematocrit: 47.8 % (ref 37.5–51.0)
Hemoglobin: 16.4 g/dL (ref 13.0–17.7)
Immature Grans (Abs): 0 10*3/uL (ref 0.0–0.1)
Immature Granulocytes: 0 %
Lymphocytes Absolute: 3.4 10*3/uL — ABNORMAL HIGH (ref 0.7–3.1)
Lymphs: 31 %
MCH: 32.2 pg (ref 26.6–33.0)
MCHC: 34.3 g/dL (ref 31.5–35.7)
MCV: 94 fL (ref 79–97)
Monocytes Absolute: 1 10*3/uL — ABNORMAL HIGH (ref 0.1–0.9)
Monocytes: 9 %
Neutrophils Absolute: 6.2 10*3/uL (ref 1.4–7.0)
Neutrophils: 57 %
Platelets: 261 10*3/uL (ref 150–450)
RBC: 5.1 x10E6/uL (ref 4.14–5.80)
RDW: 12 % (ref 11.6–15.4)
WBC: 11 10*3/uL — ABNORMAL HIGH (ref 3.4–10.8)

## 2020-02-11 LAB — COMPREHENSIVE METABOLIC PANEL
ALT: 44 IU/L (ref 0–44)
AST: 22 IU/L (ref 0–40)
Albumin/Globulin Ratio: 1.6 (ref 1.2–2.2)
Albumin: 4.7 g/dL (ref 3.8–4.9)
Alkaline Phosphatase: 82 IU/L (ref 44–121)
BUN/Creatinine Ratio: 15 (ref 9–20)
BUN: 14 mg/dL (ref 6–24)
Bilirubin Total: 0.4 mg/dL (ref 0.0–1.2)
CO2: 20 mmol/L (ref 20–29)
Calcium: 9.5 mg/dL (ref 8.7–10.2)
Chloride: 100 mmol/L (ref 96–106)
Creatinine, Ser: 0.91 mg/dL (ref 0.76–1.27)
GFR calc Af Amer: 109 mL/min/{1.73_m2} (ref >59)
GFR calc non Af Amer: 95 mL/min/{1.73_m2} (ref >59)
Globulin, Total: 3 g/dL (ref 1.5–4.5)
Glucose: 118 mg/dL — ABNORMAL HIGH (ref 65–99)
Potassium: 4.5 mmol/L (ref 3.5–5.2)
Sodium: 139 mmol/L (ref 134–144)
Total Protein: 7.7 g/dL (ref 6.0–8.5)

## 2020-02-11 LAB — LIPID PANEL
Chol/HDL Ratio: 5.2 ratio — ABNORMAL HIGH (ref 0.0–5.0)
Cholesterol, Total: 219 mg/dL — ABNORMAL HIGH (ref 100–199)
HDL: 42 mg/dL (ref >39)
LDL Chol Calc (NIH): 159 mg/dL — ABNORMAL HIGH (ref 0–99)
Triglycerides: 101 mg/dL (ref 0–149)
VLDL Cholesterol Cal: 18 mg/dL (ref 5–40)

## 2020-02-11 LAB — HIV ANTIBODY (ROUTINE TESTING W REFLEX): HIV Screen 4th Generation wRfx: NONREACTIVE

## 2020-02-11 LAB — TSH: TSH: 15.4 u[IU]/mL — ABNORMAL HIGH (ref 0.450–4.500)

## 2020-02-11 LAB — HEPATITIS C ANTIBODY: Hep C Virus Ab: <0.1 s/co ratio (ref 0.0–0.9)

## 2020-02-11 MED ORDER — LEVOTHYROXINE SODIUM 75 MCG PO TABS: 75.0000 ug | ORAL_TABLET | Freq: Every day | ORAL | 12 refills | Status: DC

## 2020-02-12 ENCOUNTER — Telehealth: Payer: Self-pay

## 2020-02-12 NOTE — Telephone Encounter (Signed)
Copied from [place] [id number]. Topic: General - Other >> Feb 12, 2020  9:04 AM Hinda Lenis D wrote: PT asking if his FMLA paperwork is ready for him to pick it up / please advise

## 2020-02-13 ENCOUNTER — Telehealth: Payer: Self-pay

## 2020-02-13 NOTE — Telephone Encounter (Signed)
Patient was advised and states that he will reduce his sugars. Patient would like to know what food choices is good and states that he will cut out drinking [place].Dew& coffee. Please advise.

## 2020-02-13 NOTE — Telephone Encounter (Signed)
-----   Message from Trinna Post, [place] sent at 02/13/2020  9:41 AM EDT ----- His sugars show diabetes. We will discuss the treatment of this at his follow up. But he should start working on reducing his sugars.

## 2020-02-16 NOTE — Telephone Encounter (Signed)
Patient was advised and states he will come tomorrow to pick paperwork up.

## 2020-02-16 NOTE — Telephone Encounter (Signed)
Patient called in reference to his FMLA paperwork he states that he does not mean to rush but being out of work without these forms turned in to his HR has him a bit nervous and he would like these back ASAP please. Please call patient with an update at Ph# [phone]

## 2020-02-17 LAB — HGB A1C W/O EAG: Hgb A1c MFr Bld: 7.1 % — ABNORMAL HIGH (ref 4.8–5.6)

## 2020-02-17 LAB — SPECIMEN STATUS REPORT

## 2020-02-18 ENCOUNTER — Telehealth (INDEPENDENT_AMBULATORY_CARE_PROVIDER_SITE_OTHER): Payer: Self-pay | Admitting: Gastroenterology

## 2020-02-18 ENCOUNTER — Other Ambulatory Visit: Payer: Self-pay

## 2020-02-18 DIAGNOSIS — Z1211 Encounter for screening for malignant neoplasm of colon: Secondary | ICD-10-CM

## 2020-02-18 MED ORDER — NA SULFATE-K SULFATE-MG SULF 17.5-3.13-1.6 GM/177ML PO SOLN: 1.0000 | Freq: Once | ORAL | 0 refills | Status: AC

## 2020-02-18 NOTE — Progress Notes (Signed)
Gastroenterology Pre-Procedure Review  Request Date: Friday 03/26/20 Requesting Physician: Dr. Vicente Males  PATIENT REVIEW QUESTIONS: The patient responded to the following health history questions as indicated:    1. Are you having any GI issues? Patient has hemorrhoids.  States that his rectum is swollen.  He has had this in the past and is using preparation H or TUCKS Pads. 2. Do you have a personal history of Polyps? no 3. Do you have a family history of Colon Cancer or Polyps? yes (great grandfather colon cancer, brother colon polyps) 4. Diabetes Mellitus? no 5. Joint replacements in the past 12 months?no 6. Major health problems in the past 3 months?no 7. Any artificial heart valves, MVP, or defibrillator?no    MEDICATIONS & ALLERGIES:    Patient reports the following regarding taking any anticoagulation/antiplatelet therapy:   Plavix, Coumadin, Eliquis, Xarelto, Lovenox, Pradaxa, Brilinta, or Effient? no Aspirin? no  Patient confirms/reports the following medications:  Current Outpatient Medications  Medication Sig Dispense Refill   cyclobenzaprine (FLEXERIL) 10 MG tablet Take 1 tablet (10 mg total) by mouth 3 (three) times daily as needed for muscle spasms. 30 tablet 0   levothyroxine (SYNTHROID) 75 MCG tablet Take 1 tablet (75 mcg total) by mouth daily. 30 tablet 12   meloxicam (MOBIC) 15 MG tablet Take 1 tablet (15 mg total) by mouth daily. 30 tablet 1   diazepam (VALIUM) 5 MG tablet Take one tablet an hour before procedure. (Patient not taking: Reported on 02/18/2020) 5 tablet 0   loratadine (CLARITIN) 10 MG tablet Take 10 mg by mouth daily. (Patient not taking: Reported on 02/18/2020)     Na Sulfate-K Sulfate-Mg Sulf 17.5-3.13-1.6 GM/177ML SOLN Take 1 kit by mouth once for 1 dose. 354 mL 0   nicotine (NICODERM CQ) 7 mg/24hr patch Place single new patch on arm daily for weeks 9-10 (Patient not taking: Reported on 05/31/2018) 14 patch 0   No current facility-administered  medications for this visit.    Patient confirms/reports the following allergies:  No Known Allergies  Orders Placed This Encounter  Procedures   Procedural/ Surgical Case Request: COLONOSCOPY WITH PROPOFOL    Standing Status:   Standing    Number of Occurrences:   1    Order Specific Question:   Pre-op diagnosis    Answer:   screening colonoscopy    Order Specific Question:   CPT Code    Answer:   30131    AUTHORIZATION INFORMATION Primary Insurance: 1D#: Group #:  Secondary Insurance: 1D#: Group #:  SCHEDULE INFORMATION: Date: Friday 03/26/20 Time: Location:[facility]

## 2020-02-25 ENCOUNTER — Other Ambulatory Visit: Payer: Managed Care, Other (non HMO) | Admitting: Orthotics

## 2020-02-29 ENCOUNTER — Ambulatory Visit
Admission: RE | Admit: 2020-02-29 | Discharge: 2020-02-29 | Disposition: A | Discharge status: Home / Self Care | Payer: Managed Care, Other (non HMO) | Source: Ambulatory Visit | Attending: Neurosurgery | Admitting: Neurosurgery

## 2020-02-29 DIAGNOSIS — M4714 Other spondylosis with myelopathy, thoracic region: Secondary | ICD-10-CM

## 2020-02-29 IMAGING — MR MR THORACIC SPINE W/O CM
4 of 5 series · 16 of 48 positions shown · non-contrast
Comparison: None.
COMPARISON: None.

Addendum:
CLINICAL DATA: Skin numbness for 6-8 weeks.  Left leg weakness.

EXAM:
MRI THORACIC SPINE WITHOUT CONTRAST
TECHNIQUE: Multiplanar, multisequence MR imaging of the thoracic spine was
performed. No intravenous contrast was administered.

[Series 4: STIR · sagittal · 3.0mm · 1.09mm/px · 3 of 19 slices shown]
[im 4/19]
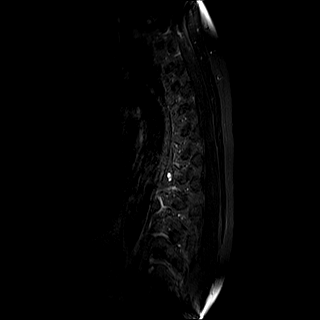
[im 11/19]
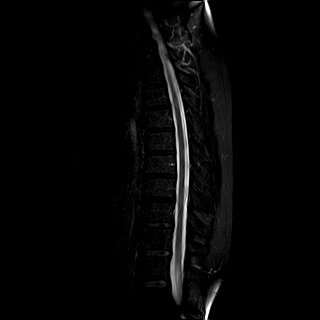
[im 19/19]
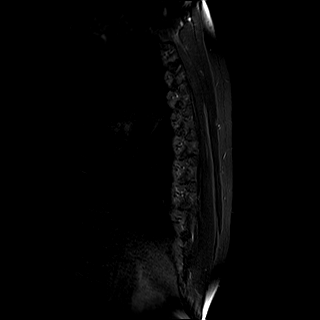

[Series 5: T2 post-contrast · sagittal · 3.0mm · 0.55mm/px · 7 of 19 slices shown]
[im 1/19]
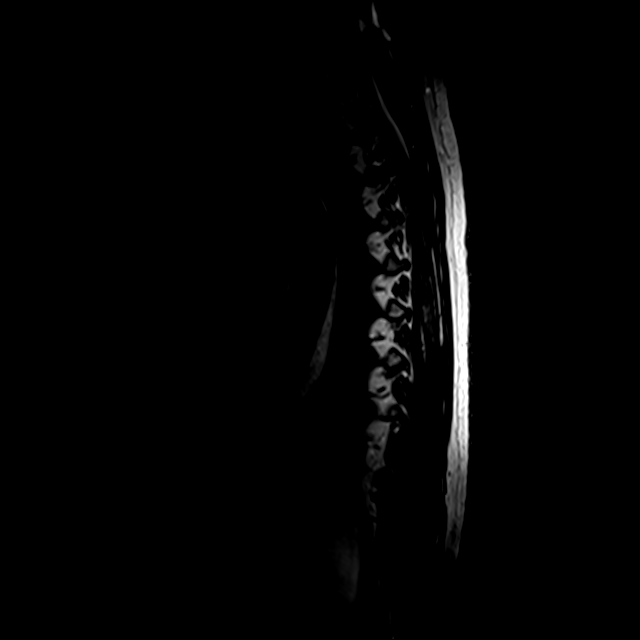
[im 4/19]
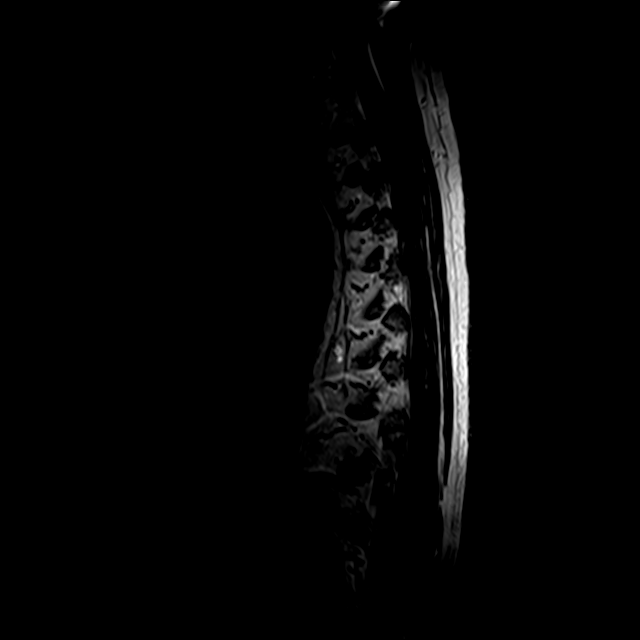
[im 7/19]
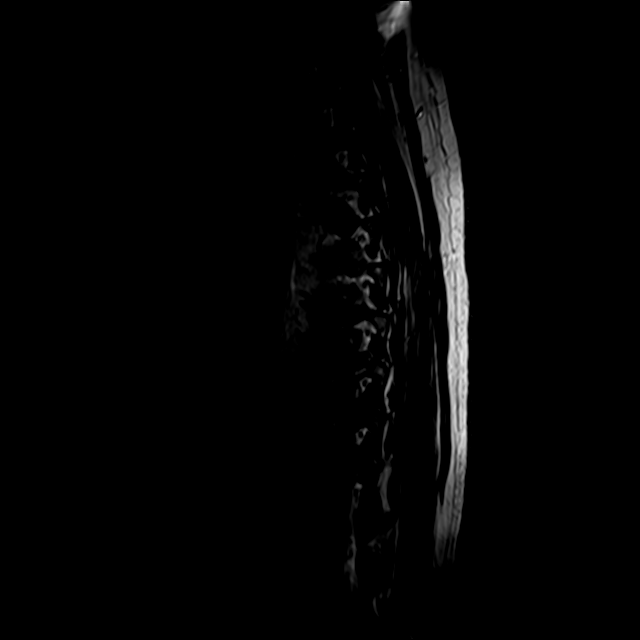
[im 10/19]
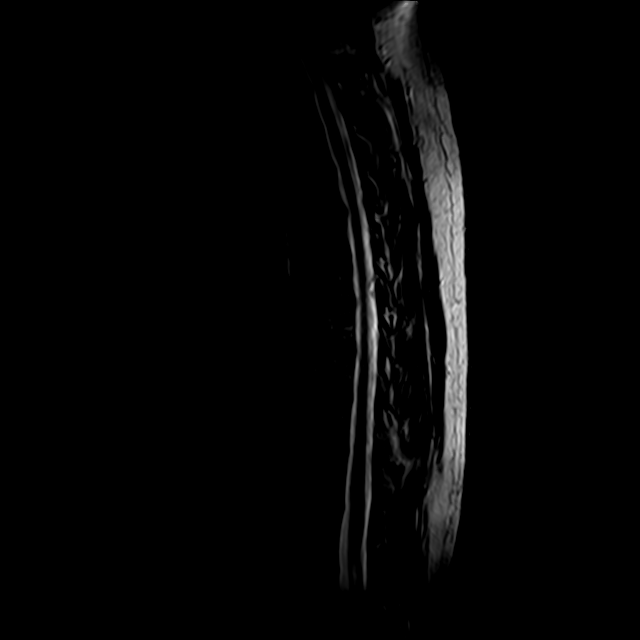
[im 13/19]
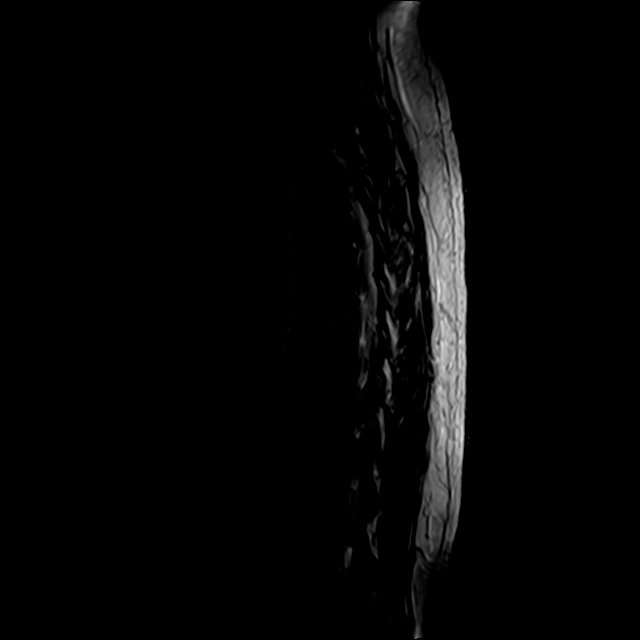
[im 16/19]
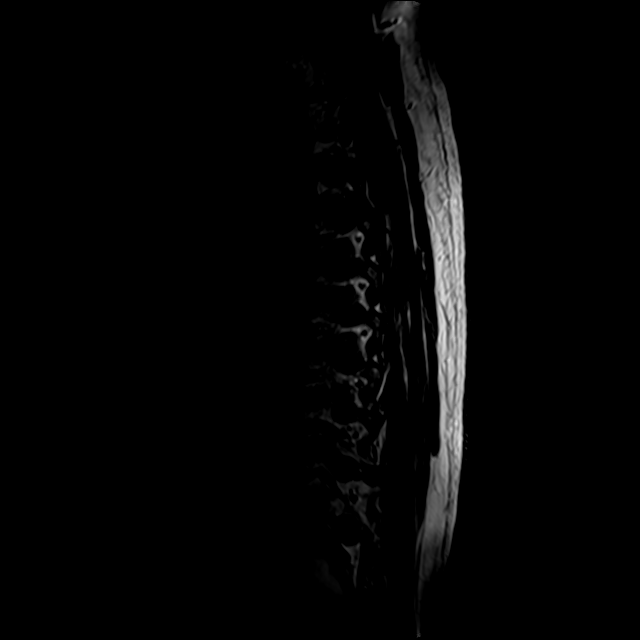
[im 19/19]
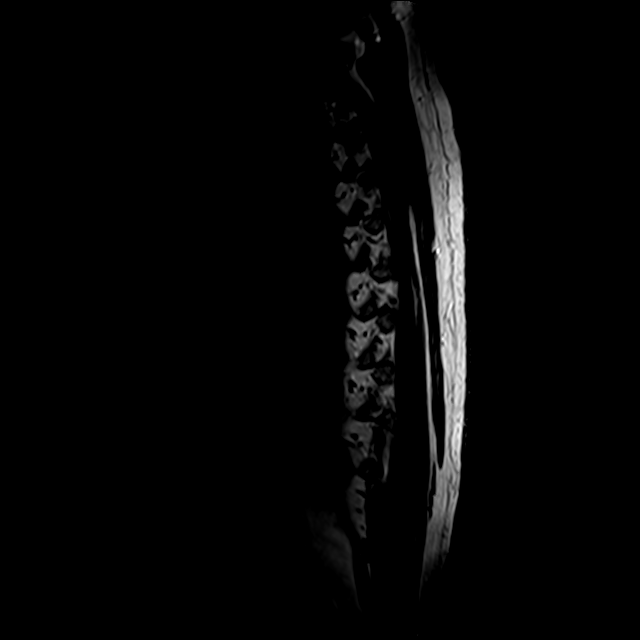

[Series 6: T1 · sagittal · 3.0mm · 0.55mm/px · 3 of 19 slices shown]
[im 4/19]
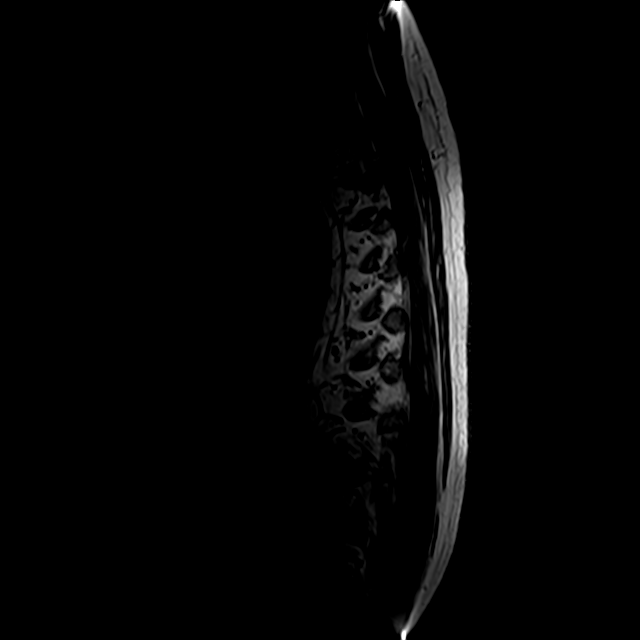
[im 10/19]
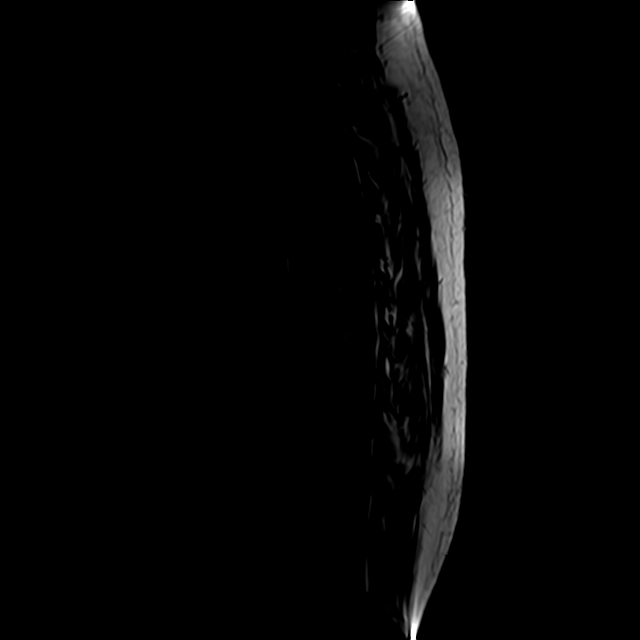
[im 16/19]
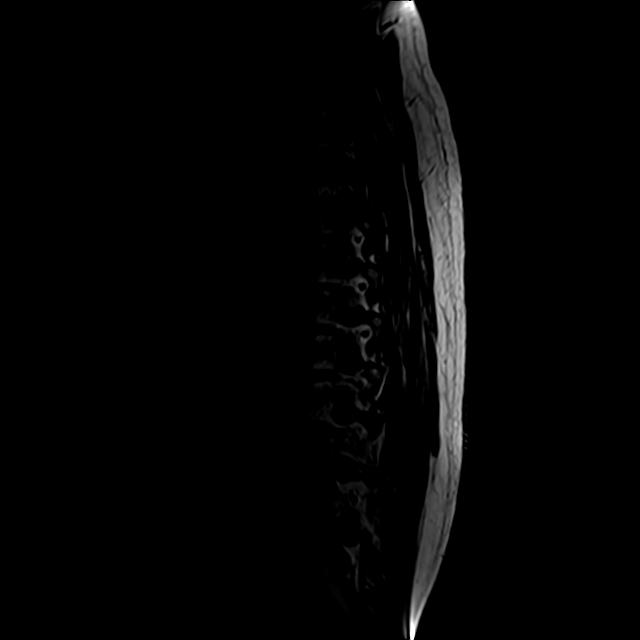

[Series 7: T2 · axial · 4.0mm · 0.39mm/px · z∈[-287,-109]mm · 3 of 38 slices shown]
[im 6/38]
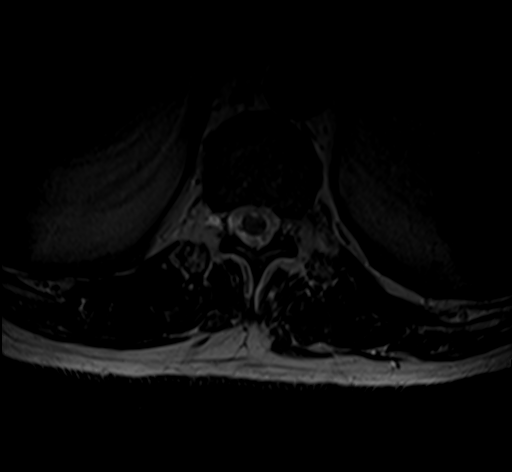
[im 20/38]
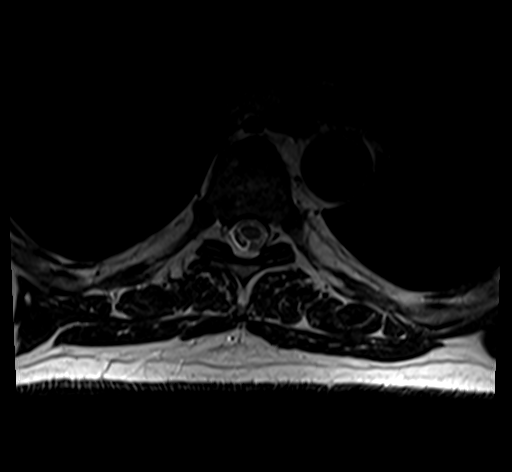
[im 32/38]
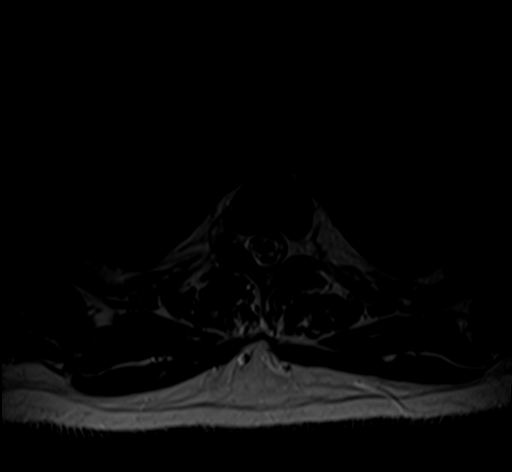

[16 of 48 positions shown; findings below may reference images not displayed]

FINDINGS: Alignment:  No significant listhesis is present.

Vertebrae: Marrow signal and vertebral body heights are normal.

Cord:  Normal signal and morphology.

Paraspinal and other soft tissues: Paraspinous soft tissues are
within normal limits. Visualized mediastinum is unremarkable.
Visualized lung fields are clear.

Disc levels:

A shallow left paramedian disc protrusion is present at T5-6 without
significant stenosis. No other significant disc protrusions are
present. Central canal and foramina are patent throughout the
thoracic spine.
IMPRESSION: 1. Shallow left paramedian disc protrusion at T5-6 without
significant stenosis.
2. No other significant disc protrusions or stenosis.

ADDENDUM:
Focal T2 signal hyperintensity is present in the central cord at the
T1-2 level. The cord is slightly expanded at this level as well.
This raises concern for intra-axial mass such is astrocytoma or less
likely metastasis.

Recommend MRI of the cervical and thoracic spine with contrast for
further evaluation.

The findings were discussed with Dr. ALICE.

*** End of Addendum ***
FINDINGS: Alignment:  No significant listhesis is present.

Vertebrae: Marrow signal and vertebral body heights are normal.

Cord:  Normal signal and morphology.

Paraspinal and other soft tissues: Paraspinous soft tissues are
within normal limits. Visualized mediastinum is unremarkable.
Visualized lung fields are clear.

Disc levels:

A shallow left paramedian disc protrusion is present at T5-6 without
significant stenosis. No other significant disc protrusions are
present. Central canal and foramina are patent throughout the
thoracic spine.
IMPRESSION: 1. Shallow left paramedian disc protrusion at T5-6 without
significant stenosis.
2. No other significant disc protrusions or stenosis.

## 2020-03-08 ENCOUNTER — Telehealth: Payer: Self-pay

## 2020-03-08 NOTE — Telephone Encounter (Signed)
Copied from [place] [id number]. Topic: General - Other >> Mar 08, 2020 10:06 AM Celene Kras wrote: Reason for CRM: Pt called and is requesting to have more information regarding setting up an appt to have imaging done on his hand. Please advise.

## 2020-03-09 ENCOUNTER — Ambulatory Visit
Admission: RE | Admit: 2020-03-09 | Discharge: 2020-03-09 | Disposition: A | Discharge status: Home / Self Care | Payer: Managed Care, Other (non HMO) | Attending: Physician Assistant | Admitting: Physician Assistant

## 2020-03-09 ENCOUNTER — Ambulatory Visit (INDEPENDENT_AMBULATORY_CARE_PROVIDER_SITE_OTHER): Payer: Managed Care, Other (non HMO) | Admitting: Podiatry

## 2020-03-09 ENCOUNTER — Other Ambulatory Visit: Payer: Self-pay

## 2020-03-09 ENCOUNTER — Ambulatory Visit
Admission: RE | Admit: 2020-03-09 | Discharge: 2020-03-09 | Disposition: A | Discharge status: Home / Self Care | Payer: Managed Care, Other (non HMO) | Source: Ambulatory Visit | Attending: Physician Assistant | Admitting: Physician Assistant

## 2020-03-09 DIAGNOSIS — M722 Plantar fascial fibromatosis: Secondary | ICD-10-CM

## 2020-03-09 DIAGNOSIS — M79642 Pain in left hand: Secondary | ICD-10-CM
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
## 2020-03-09 NOTE — Telephone Encounter (Signed)
Patient advised.

## 2020-03-09 NOTE — Progress Notes (Signed)
   Subjective: 54 y.o. male presenting today for follow-up evaluation regarding heel pain bilateral.  Patient states that he is doing significantly better to the right foot.  Continues to have some pain and tenderness to the left foot.  He has been taking the meloxicam as prescribed  No past medical history on file.   Objective: Physical Exam General: The patient is alert and oriented x3 in no acute distress.  Dermatology: Skin is warm, dry and supple bilateral lower extremities. Negative for open lesions or macerations bilateral.   Vascular: Dorsalis Pedis and Posterior Tibial pulses palpable bilateral.  Capillary fill time is immediate to all digits.  Neurological: Epicritic and protective threshold intact bilateral.   Musculoskeletal: Tenderness to palpation to the plantar aspect of the bilateral heels along the plantar fascia. All other joints range of motion within normal limits bilateral. Strength 5/5 in all groups bilateral.   Radiographic exam: Normal osseous mineralization. Joint spaces preserved. No fracture/dislocation/boney destruction. No other soft tissue abnormalities or radiopaque foreign bodies.   Assessment: 1. plantar fasciitis bilateral feet  Plan of Care:  1. Patient evaluated.    2. Injection of 0.5cc Celestone soluspan injected into the bilateral heels.  3.  Continue meloxicam daily as needed 4.  Patient has an appointment tomorrow, 03/10/2020 for custom orthotic scanning 5.  Continue plantar fascial braces as needed 6.  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 Edrick Kins, DPM    [address], [place] [address]                Office [phone]  Fax [phone]

## 2020-03-10 ENCOUNTER — Ambulatory Visit (INDEPENDENT_AMBULATORY_CARE_PROVIDER_SITE_OTHER): Payer: Managed Care, Other (non HMO) | Admitting: Orthotics

## 2020-03-10 ENCOUNTER — Other Ambulatory Visit: Payer: Self-pay | Admitting: Neurosurgery

## 2020-03-10 DIAGNOSIS — M722 Plantar fascial fibromatosis: Secondary | ICD-10-CM

## 2020-03-10 DIAGNOSIS — M4714 Other spondylosis with myelopathy, thoracic region: Secondary | ICD-10-CM

## 2020-03-10 NOTE — Progress Notes (Signed)

## 2020-03-14 ENCOUNTER — Other Ambulatory Visit: Payer: Self-pay

## 2020-03-14 ENCOUNTER — Ambulatory Visit
Admission: RE | Admit: 2020-03-14 | Discharge: 2020-03-14 | Disposition: A | Discharge status: Home / Self Care | Payer: Managed Care, Other (non HMO) | Source: Ambulatory Visit | Attending: Neurosurgery | Admitting: Neurosurgery

## 2020-03-14 DIAGNOSIS — M4714 Other spondylosis with myelopathy, thoracic region: Secondary | ICD-10-CM

## 2020-03-14 IMAGING — MR MR THORACIC SPINE W/ CM
4 of 7 series · 18 of 48 positions shown · IV contrast (agent unspecified)
Comparison: MRI thoracic spine, [DATE]

CLINICAL DATA: Abnormal cord lesion seen on recent MRI of the
thoracic spine.

EXAM:
MRI THORACIC SPINE WITH CONTRAST
TECHNIQUE: Multiplanar, multisequence MR imaging of the thoracic spine was
performed following the administration of intravenous contrast.

[Series 4: T1 · sagittal · 4.0mm · 0.68mm/px · 4 of 12 slices shown (1 of 3)]
[im 1/12]
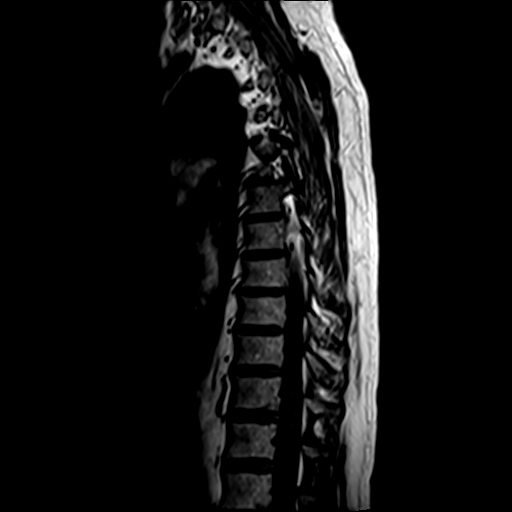
[im 4/12]
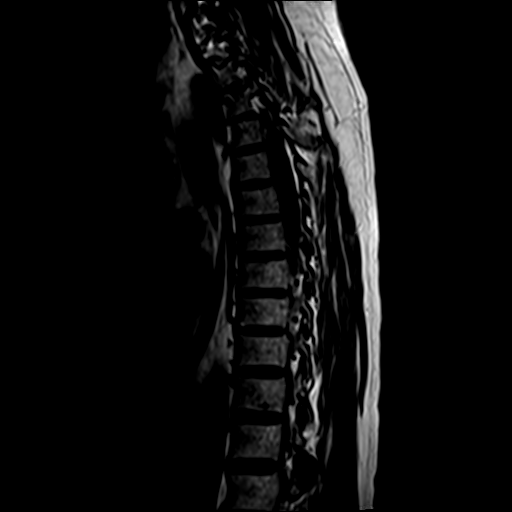
[im 8/12]
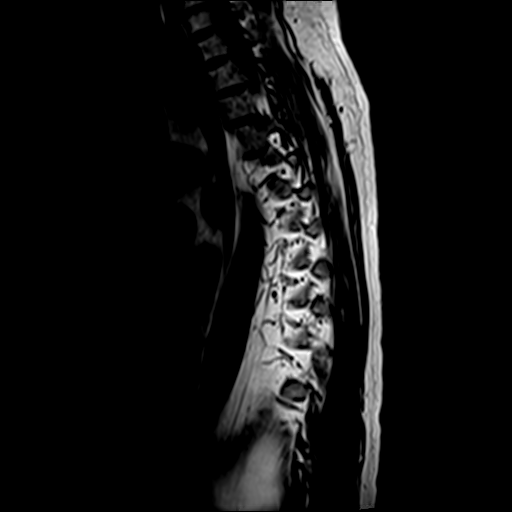
[im 12/12]
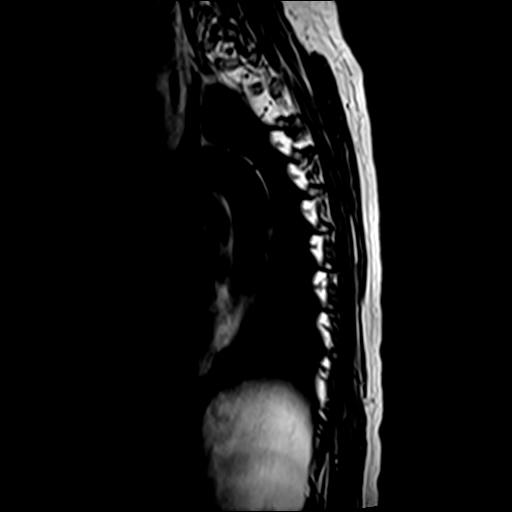

[Series 5: T1 · axial · 4.0mm · 0.39mm/px · z∈[-366,-113]mm · 6 of 42 slices shown (2 of 3)]
[im 1/42]
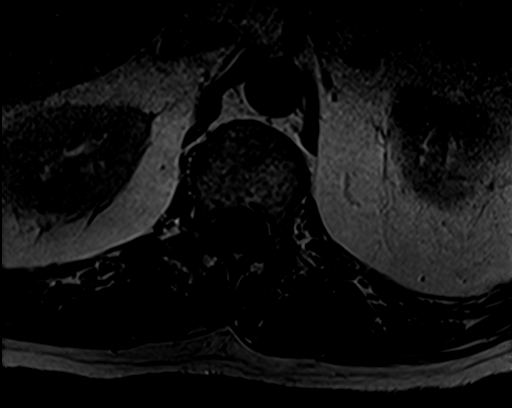
[im 7/42]
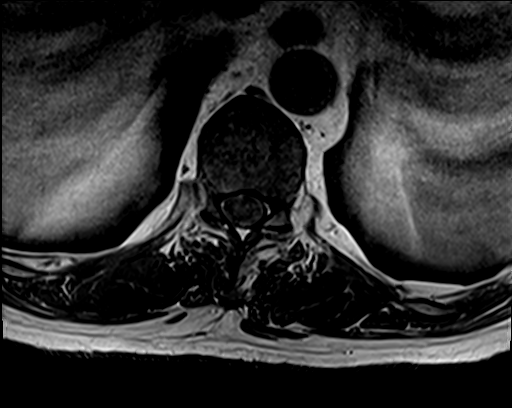
[im 14/42]
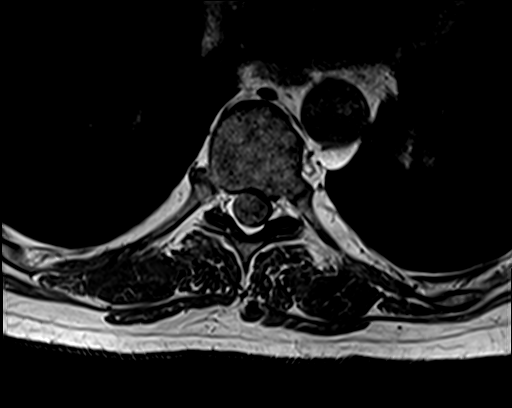
[im 18/42]
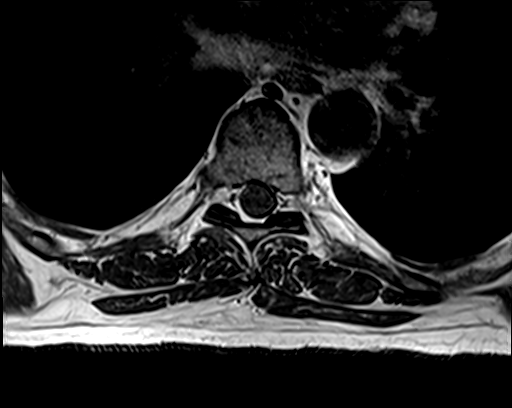
[im 21/42]
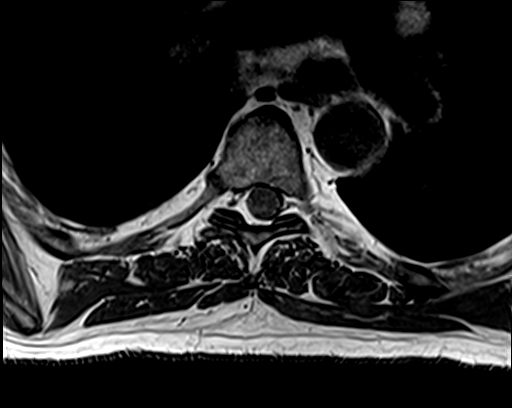
[im 35/42]
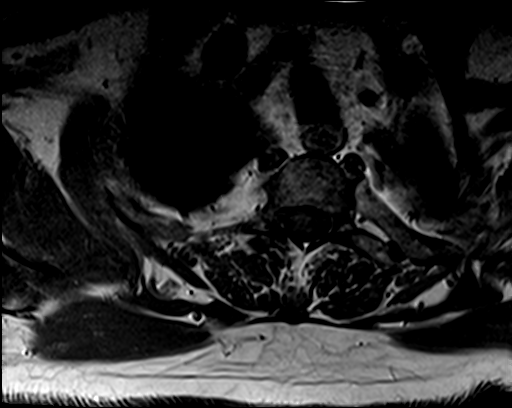

[Series 6: T1 · axial · 4.0mm · 0.39mm/px · z∈[-293,-113]mm · 3 of 42 slices shown (3 of 3)]
[im 7/42]
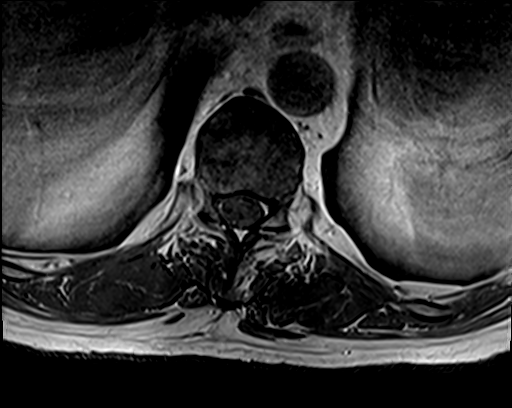
[im 21/42]
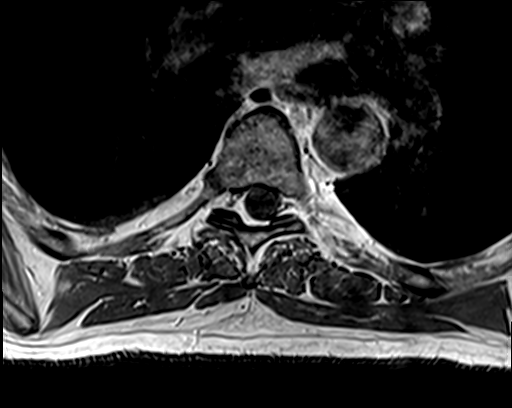
[im 35/42]
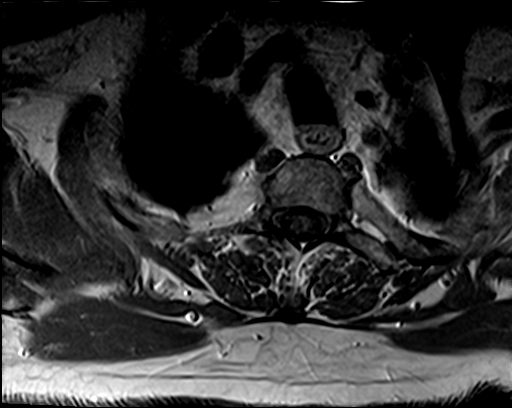

[Series 7: T2 · sagittal · 4.0mm · 0.55mm/px · 5 of 16 slices shown]
[im 1/16]
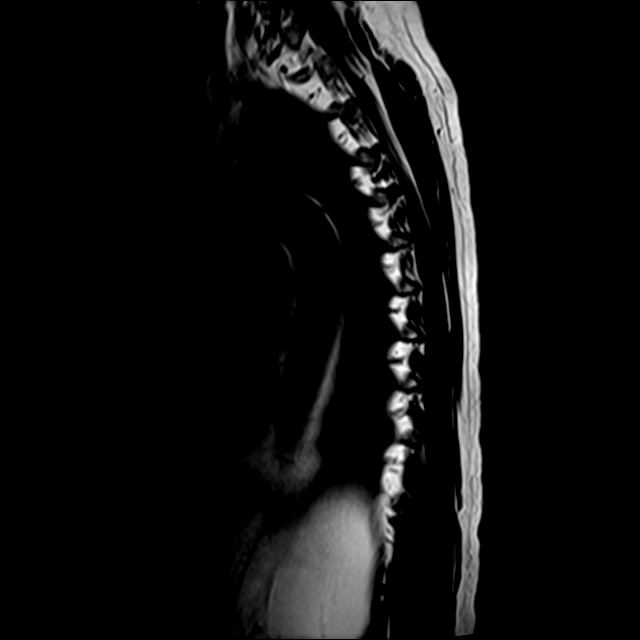
[im 4/16]
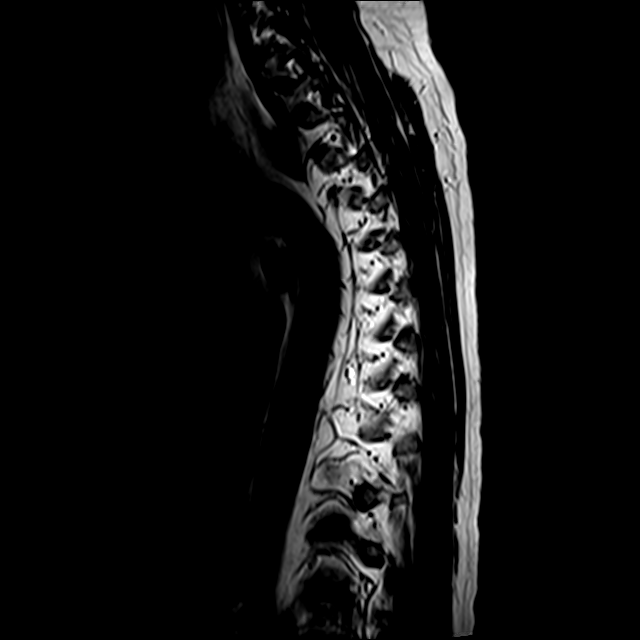
[im 8/16]
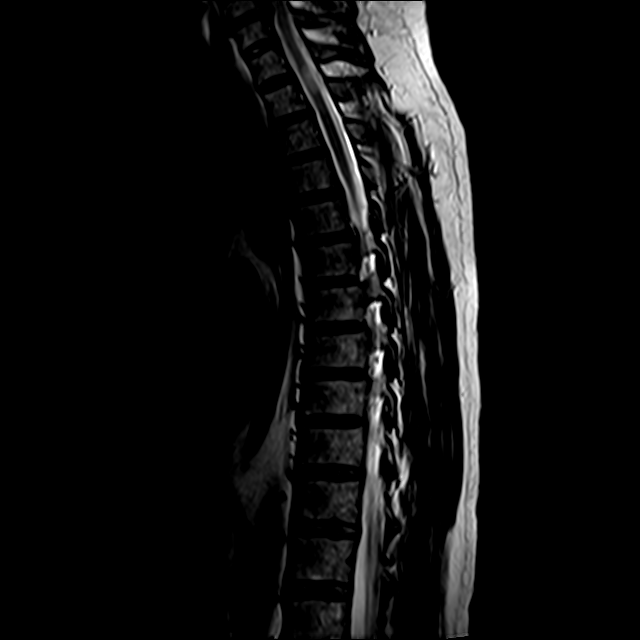
[im 12/16]
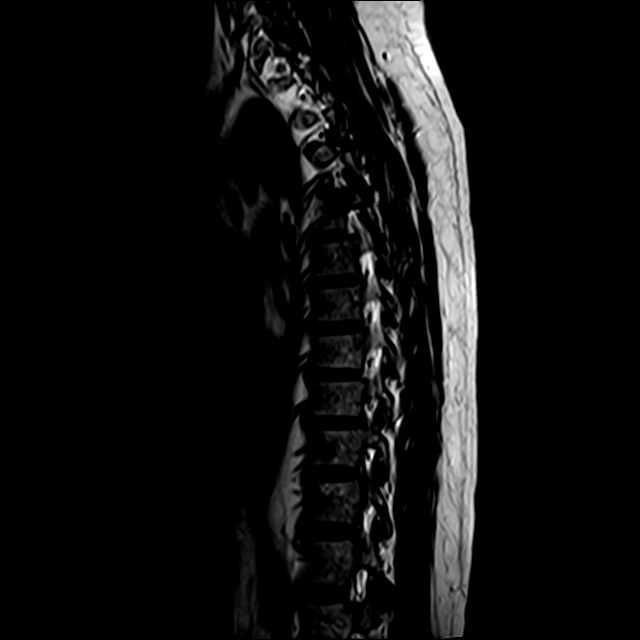
[im 16/16]
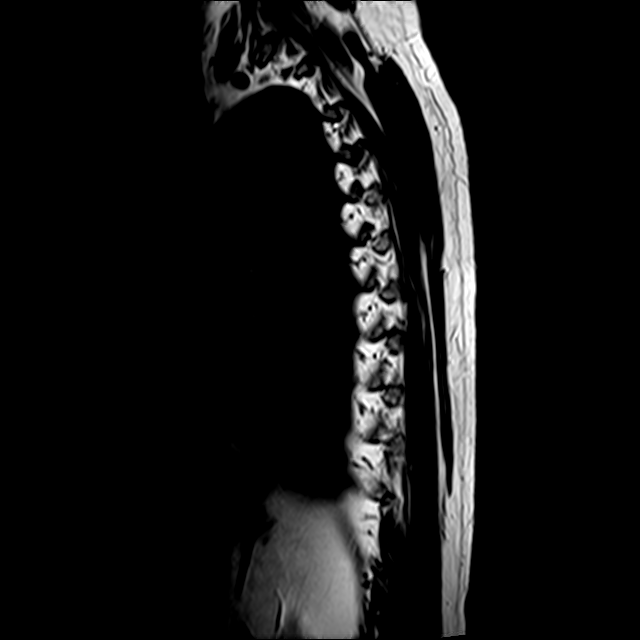

[18 of 48 positions shown; findings below may reference images not displayed]

FINDINGS: Note: Series 6 is the postcontrast axial T1 even though it is
labeled as precontrast T1.

Alignment:  Physiologic.

Vertebrae: No evidence of acute fracture, evidence of discitis, or
bone lesion.

Cord/canal: The area of cord signal abnormality described on recent
MRI demonstrates subtle nodular enhancement. There is suggestion of
a focus of nodular enhancement in the right eccentric cord at C6
which is only imaged on the sagittal sequence (series 9, image 5).
Additionally, there is suggestion of multiple small nodular areas of
enhancement along the thoracic cord, most conspicuous at T11-T12
(see series 8, image 7). Additional possible small nodular areas of
enhancement at T3-T4 (series 8, image 9) and T5 (series 8, image 8).

Paraspinal and other soft tissues: Unremarkable.

Disc levels:

Mild multilevel degenerative change without significant canal or
foraminal stenosis.
IMPRESSION: Subtle, somewhat nodular focus of enhancement at the site of
abnormal cord signal at T1-T2, seen on recent MRI of the thoracic
spine. There is suggestion of additional small nodular areas of
enhancement along the cord in the lower cervical spine and elsewhere
in the thoracic spine, as detailed above. Overall, findings are
concerning for abnormal leptomeningeal enhancement with differential
considerations including metastatic deposits and granulomatous
disease. Recommend imaging of the brain with and without contrast
and correlation with CSF cytology.

These results will be called to the ordering clinician or
representative by the Radiologist Assistant, and communication
documented in the PACS or [REDACTED].

## 2020-03-14 MED ORDER — GADOBENATE DIMEGLUMINE 529 MG/ML IV SOLN
20.0000 mL | Freq: Once | INTRAVENOUS | Status: AC | PRN
  Administered 2020-03-14: 20 mL via INTRAVENOUS

## 2020-03-16 ENCOUNTER — Other Ambulatory Visit: Payer: Self-pay | Admitting: Physician Assistant

## 2020-03-16 ENCOUNTER — Other Ambulatory Visit: Payer: Self-pay | Admitting: Student

## 2020-03-16 DIAGNOSIS — G379 Demyelinating disease of central nervous system, unspecified: Secondary | ICD-10-CM

## 2020-03-16 DIAGNOSIS — F419 Anxiety disorder, unspecified: Secondary | ICD-10-CM

## 2020-03-17 ENCOUNTER — Ambulatory Visit
Admission: RE | Admit: 2020-03-17 | Discharge: 2020-03-17 | Disposition: A | Discharge status: Home / Self Care | Payer: Managed Care, Other (non HMO) | Source: Ambulatory Visit | Attending: Student | Admitting: Student

## 2020-03-17 ENCOUNTER — Other Ambulatory Visit: Payer: Self-pay | Admitting: Physician Assistant

## 2020-03-17 ENCOUNTER — Other Ambulatory Visit: Payer: Self-pay

## 2020-03-17 DIAGNOSIS — G379 Demyelinating disease of central nervous system, unspecified: Secondary | ICD-10-CM

## 2020-03-17 DIAGNOSIS — F419 Anxiety disorder, unspecified: Secondary | ICD-10-CM

## 2020-03-17 IMAGING — MR MR HEAD WO/W CM
14 series · 48 of 48 positions shown · IV contrast (multihance)
Comparison: MRI thoracic spine with contrast [DATE]

CLINICAL DATA: Demyelinating disease. Abnormal cord enhancement on
recent thoracic MRI.

EXAM:
MRI HEAD WITHOUT AND WITH CONTRAST
TECHNIQUE: Multiplanar, multiecho pulse sequences of the brain and surrounding
structures were obtained without and with intravenous contrast.
CONTRAST:  20mL MULTIHANCE GADOBENATE DIMEGLUMINE 529 MG/ML IV SOLN

[Series 5: T1 · sagittal · 4.0mm · 0.75mm/px · 3 of 31 slices shown (1 of 3)]
[im 1/31]
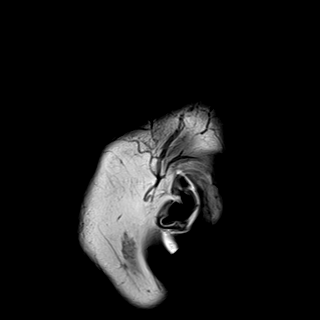
[im 16/31]
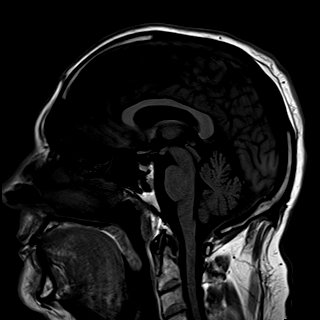
[im 31/31]
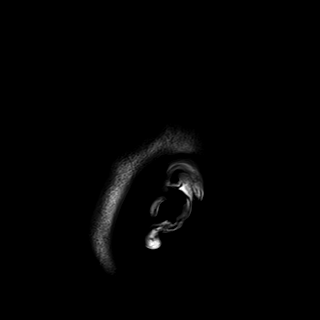

[Series 6: FLAIR · sagittal · 4.0mm · 0.72mm/px · 1 of 26 slices shown (1 of 2)]
[im 1/26]
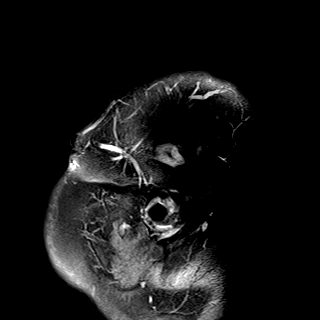

[Series 7: DWI · axial · 3.0mm · 1.44mm/px · z∈[-24,+106]mm · 5 of 84 slices shown (1 of 4)]
[im 1/84]
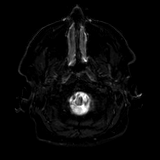
[im 21/84]
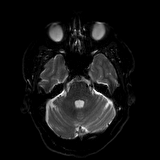
[im 42/84]
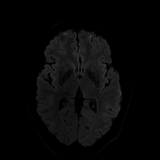
[im 63/84]
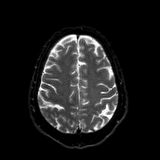
[im 84/84]
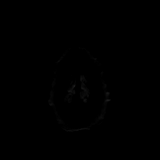

[Series 8: DWI · axial · 3.0mm · 1.44mm/px · z∈[-24,+106]mm · 2 of 42 slices shown (2 of 4)]
[im 1/42]
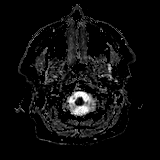
[im 42/42]
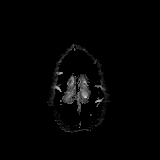

[Series 9: DWI · coronal · 5.0mm · 1.44mm/px · 3 of 60 slices shown (3 of 4)]
[im 1/60]
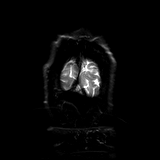
[im 30/60]
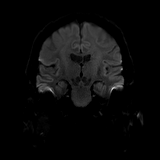
[im 60/60]
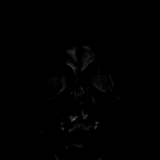

[Series 10: DWI · coronal · 5.0mm · 1.44mm/px · 2 of 30 slices shown (4 of 4)]
[im 1/30]
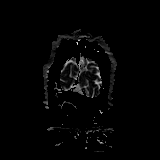
[im 30/30]
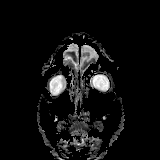

[Series 11: T2 · axial · 4.0mm · 0.36mm/px · z∈[-28,+103]mm · 2 of 27 slices shown]
[im 1/27]
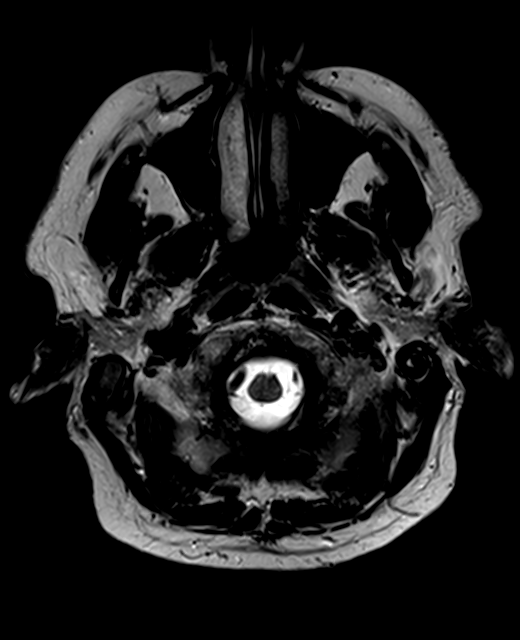
[im 27/27]
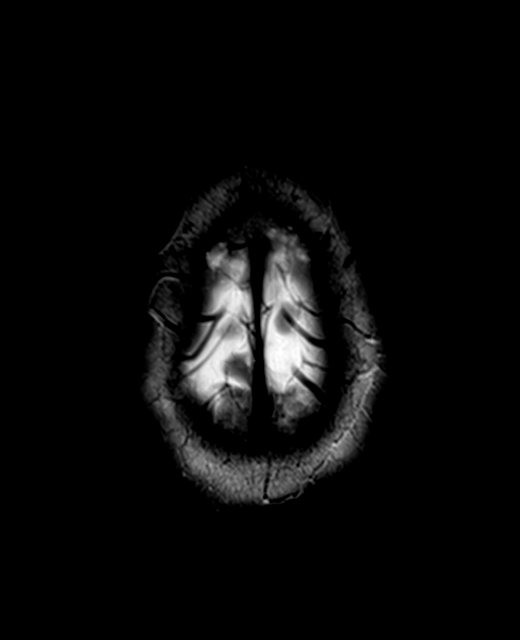

[Series 12: FLAIR · axial · 3.0mm · 0.72mm/px · 1 of 26 slices shown (2 of 2)]
[im 1/26]
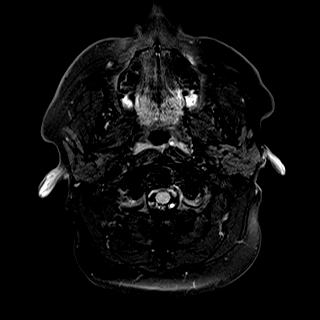

[Series 14: swi_images · axial · 1.5mm · 0.90mm/px · z∈[-31,+107]mm · 5 of 96 slices shown]
[im 1/96]
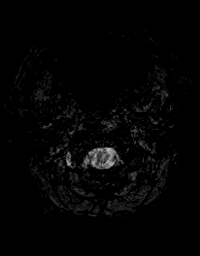
[im 24/96]
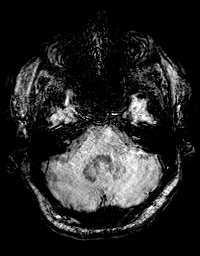
[im 48/96]
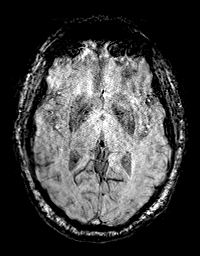
[im 72/96]
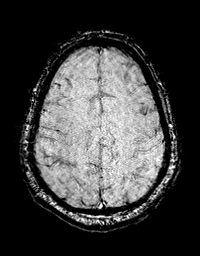
[im 96/96]
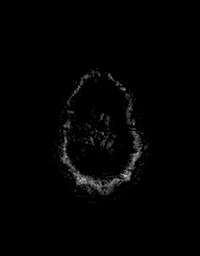

[Series 15: T1 · axial · 1.0mm · 0.94mm/px · z∈[-46,+110]mm · 9 of 160 slices shown (2 of 3)]
[im 1/160]
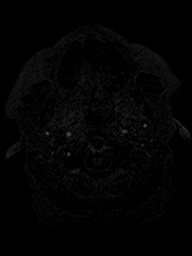
[im 20/160]
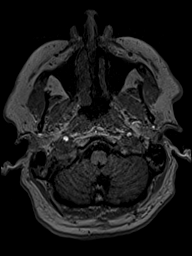
[im 40/160]
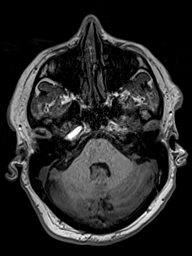
[im 60/160]
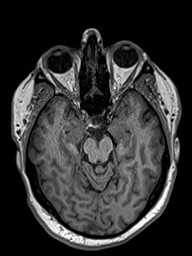
[im 80/160]
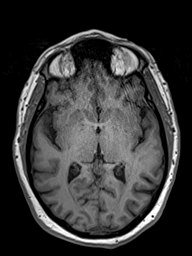
[im 100/160]
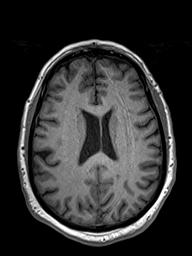
[im 120/160]
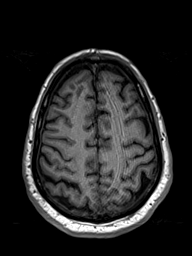
[im 140/160]
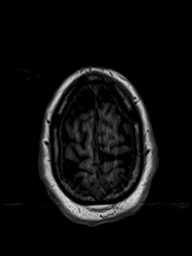
[im 160/160]
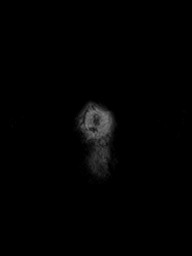

[Series 16: T2 post-contrast · coronal · 4.0mm · 0.36mm/px · 2 of 35 slices shown]
[im 1/35]
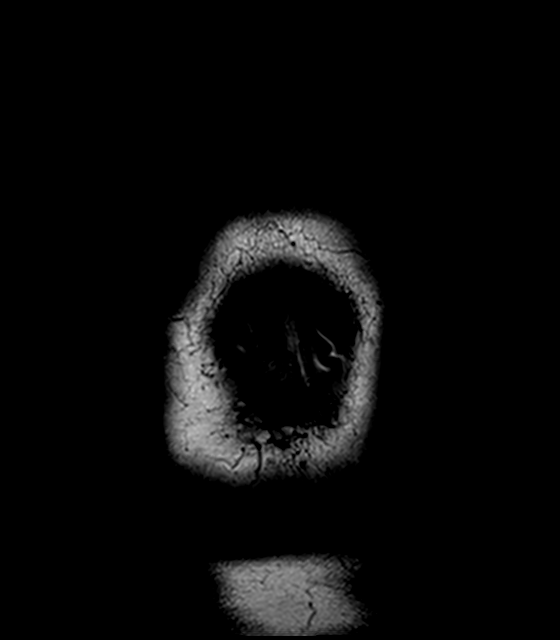
[im 35/35]
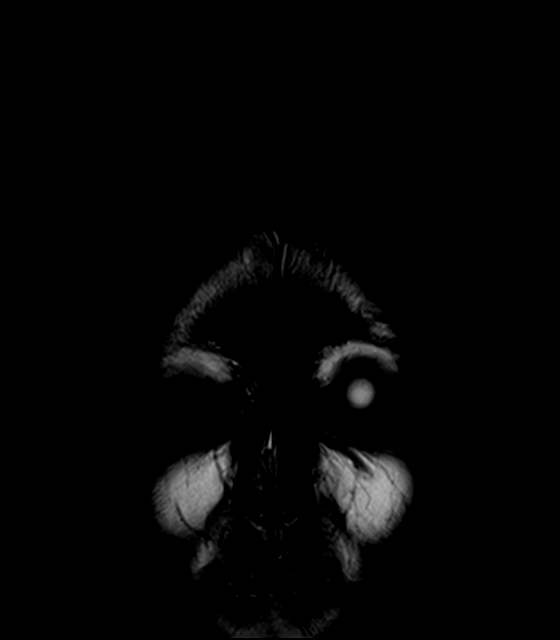

[Series 17: T1 · axial · 1.0mm · 0.94mm/px · z∈[-46,+110]mm · 9 of 160 slices shown (3 of 3)]
[im 1/160]
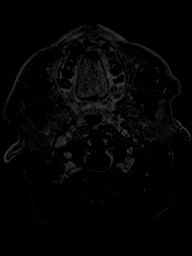
[im 20/160]
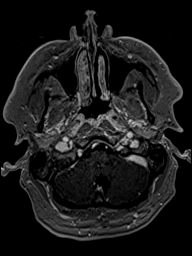
[im 40/160]
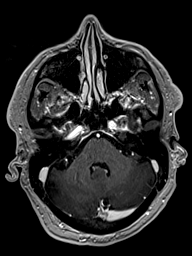
[im 60/160]
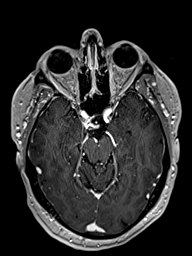
[im 80/160]
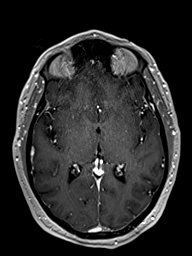
[im 100/160]
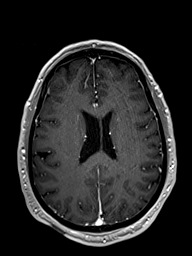
[im 120/160]
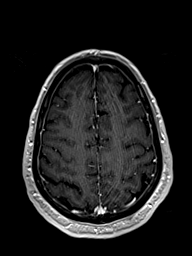
[im 140/160]
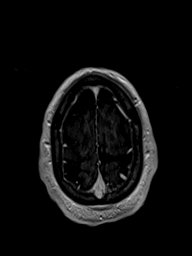
[im 160/160]
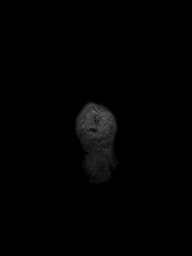

[Series 18: T1 post-contrast · coronal · 4.0mm · 0.72mm/px · 2 of 35 slices shown (1 of 2)]
[im 1/35]
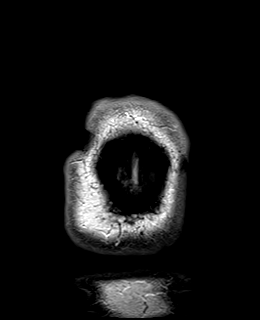
[im 35/35]
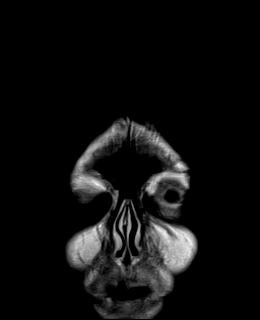

[Series 19: T1 post-contrast · sagittal · 4.0mm · 0.75mm/px · 2 of 31 slices shown (2 of 2)]
[im 1/31]
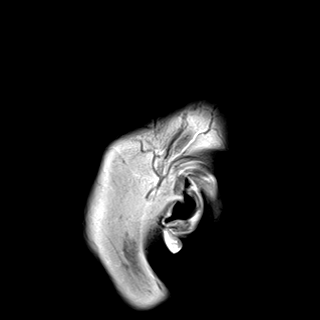
[im 31/31]
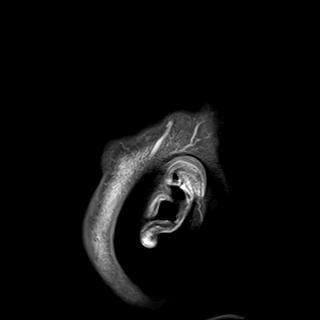

[48 of 48 positions shown; findings below may reference images not displayed]

FINDINGS: Brain: Ventricle size and cerebral volume normal. Negative for acute
infarct.

Mild white matter changes. Mild hyperintensity in the
periventricular white matter which is mostly confluent. Few small
deep white matter hyperintensities in both cerebral hemispheres.
Negative for hemorrhage or mass

Postcontrast imaging degraded by motion however no abnormal
enhancement identified. No enhancing mass. No abnormal
leptomeningeal enhancement.

Vascular: Normal arterial flow voids.

Skull and upper cervical spine: No focal skeletal lesion.

Sinuses/Orbits: Paranasal sinuses clear. Negative orbit. Left
mastoid effusion

Other: None
IMPRESSION: No acute abnormality

Mild white matter changes. Given the patient's age, these are most
likely due to chronic microvascular ischemic change. Demyelinating
disease is a consideration particularly given the abnormal spinal
cord in the cervical and thoracic spine.

No abnormal leptomeningeal enhancement.

## 2020-03-17 MED ORDER — GADOBENATE DIMEGLUMINE 529 MG/ML IV SOLN
20.0000 mL | Freq: Once | INTRAVENOUS | Status: AC | PRN
  Administered 2020-03-17: 20 mL via INTRAVENOUS

## 2020-03-17 MED ORDER — DIAZEPAM 5 MG PO TABS: ORAL_TABLET | ORAL | 0 refills | Status: DC

## 2020-03-17 NOTE — Telephone Encounter (Signed)
Medication Refill - Medication: diazepam (VALIUM) 5 MG tablet  Pt asked for a call when sent / needs for 11am MRI this morning   Has the patient contacted their pharmacy? No. (Agent: If no, request that the patient contact the pharmacy for the refill.) (Agent: If yes, when and what did the pharmacy advise?)  Preferred Pharmacy (with phone number or street name):  [id number] - [place], [place] Phone:  [phone]  Fax:  [phone]       Agent: Please be advised that RX refills may take up to 3 business days. We ask that you follow-up with your pharmacy.

## 2020-03-19 ENCOUNTER — Other Ambulatory Visit: Payer: Self-pay | Admitting: Neurosurgery

## 2020-03-19 ENCOUNTER — Other Ambulatory Visit (HOSPITAL_COMMUNITY): Payer: Self-pay | Admitting: Neurosurgery

## 2020-03-19 DIAGNOSIS — G35 Multiple sclerosis: Secondary | ICD-10-CM

## 2020-03-23 ENCOUNTER — Telehealth: Payer: Self-pay

## 2020-03-23 ENCOUNTER — Other Ambulatory Visit: Payer: Self-pay

## 2020-03-23 DIAGNOSIS — Z1211 Encounter for screening for malignant neoplasm of colon: Secondary | ICD-10-CM | POA: Insufficient documentation

## 2020-03-23 NOTE — Telephone Encounter (Signed)
Returned patients call. Pt requested to cancel procedure per PCP request. Will call back to reschedule. LVM

## 2020-03-24 ENCOUNTER — Other Ambulatory Visit: Payer: Self-pay

## 2020-03-24 ENCOUNTER — Ambulatory Visit (HOSPITAL_COMMUNITY)
Admission: RE | Admit: 2020-03-24 | Discharge: 2020-03-24 | Disposition: A | Discharge status: Home / Self Care | Payer: Managed Care, Other (non HMO) | Source: Ambulatory Visit | Attending: Neurosurgery | Admitting: Neurosurgery

## 2020-03-24 DIAGNOSIS — G35 Multiple sclerosis: Secondary | ICD-10-CM | POA: Insufficient documentation

## 2020-03-24 LAB — CSF CELL COUNT WITH DIFFERENTIAL
Eosinophils, CSF: NONE SEEN % (ref 0–1)
Lymphs, CSF: 93 % — ABNORMAL HIGH (ref 40–80)
Monocyte-Macrophage-Spinal Fluid: 7 % — ABNORMAL LOW (ref 15–45)
RBC Count, CSF: 13 /mm3 — ABNORMAL HIGH
Segmented Neutrophils-CSF: NONE SEEN % (ref 0–6)
Tube #: 3
WBC, CSF: 46 /mm3 (ref 0–5)

## 2020-03-24 IMAGING — RF DG SPINAL PUNCT LUMBAR DIAG WITH FL CT GUIDANCE
2 series · 2 of 2 positions shown · non-contrast
Comparison: none

CLINICAL DATA: Clinical concern for multiple sclerosis on previous
MRI's.

[Series 2: fluoro_iodine 2fps_bw · 0.18mm/px · 1 of 1 slices shown]
[im 1/1]
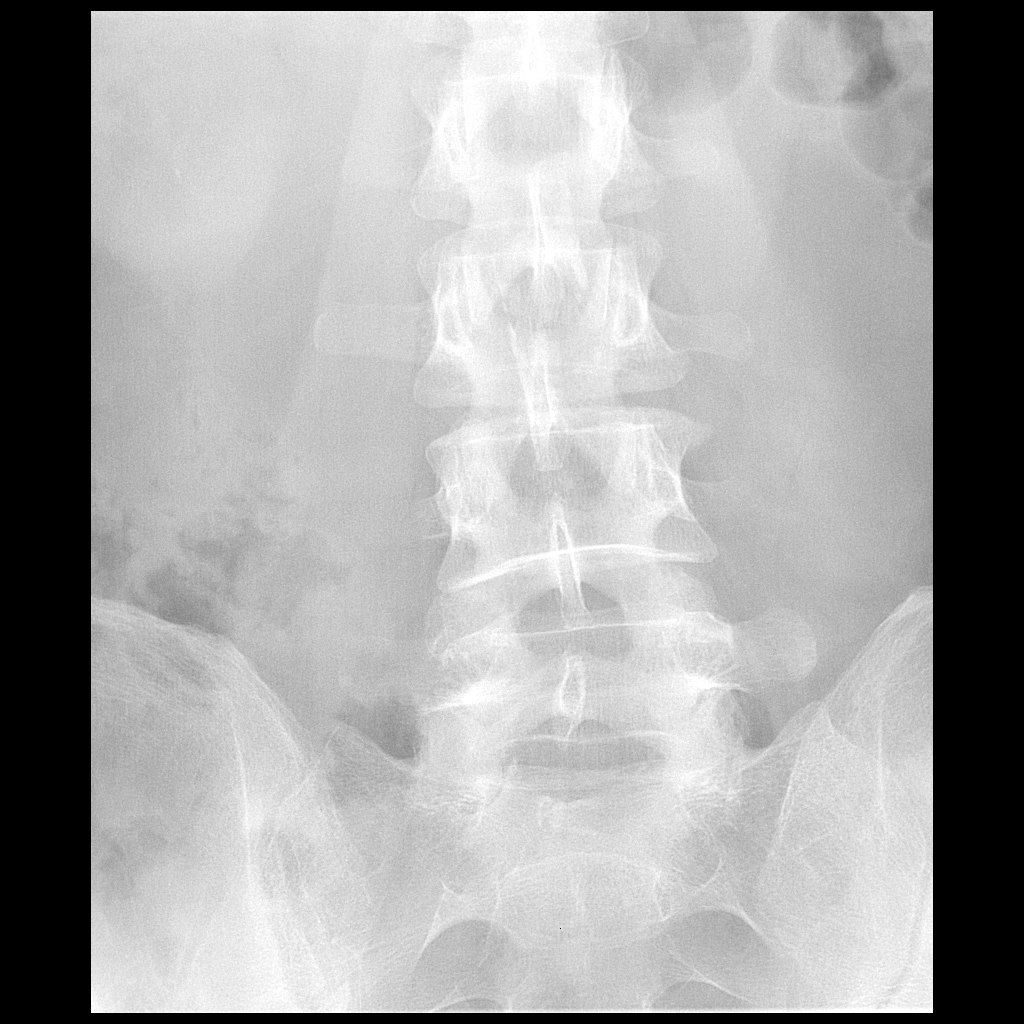

[Series 3: cp_standard · 0.17mm/px · 1 of 1 slices shown]
[im 1/1]
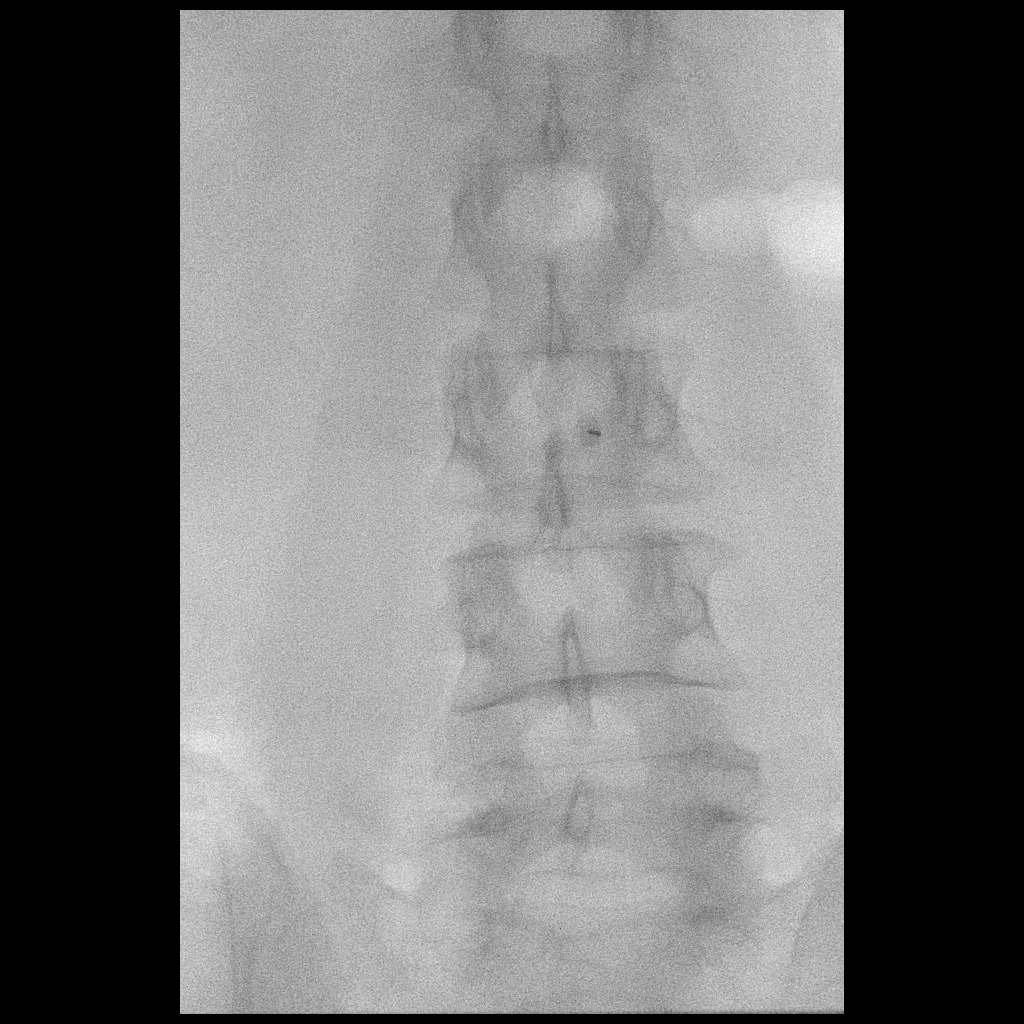

[2 of 2 positions shown; findings below may reference images not displayed]

EXAM:
DIAGNOSTIC LUMBAR PUNCTURE UNDER FLUOROSCOPIC GUIDANCE

FLUOROSCOPY TIME:  Fluoroscopy Time: 12 seconds of low-dose pulsed
fluoroscopy.

Radiation Exposure Index (if provided by the fluoroscopic device):
5.6 mGy

Number of Acquired Spot Images: 1

PROCEDURE:
The patient's prior studies were reviewed. Time out procedure was
performed. The procedure, risks (including but not limited to
bleeding, infection, organ damage ), benefits, and alternatives were
explained to the patient. Questions regarding the procedure were
encouraged and answered. The patient understands and consents to the
procedure.

Informed consent was obtained from the patient prior to the
procedure, including potential complications of headache, allergy,
and pain. With the patient prone, the lower back was prepped with
Betadine. 1% Lidocaine was used for local anesthesia. Lumbar
puncture was performed at the L2-3 level using a 20 gauge needle
with return of clear, colorless CSF with an opening pressure of 11
cm water measured prone through the needle. 7 ml of CSF were
obtained for laboratory studies. The patient tolerated the procedure
well and there were no apparent complications.
IMPRESSION: Diagnostic lumbar puncture performed without complication. Fluid was
sent for the requested studies.

## 2020-03-24 MED ORDER — LIDOCAINE HCL (PF) 1 % IJ SOLN: 5.0000 mL | Freq: Once | INTRAMUSCULAR | Status: DC

## 2020-03-24 NOTE — Discharge Instructions (Signed)
Lumbar Puncture, Care After This sheet gives you information about how to care for yourself after your procedure. Your health care provider may also give you more specific instructions. If you have problems or questions, contact your health care provider. What can I expect after the procedure? After the procedure, it is common to have:  Mild discomfort or pain at the puncture site.  A mild headache that is relieved with pain medicines. Follow these instructions at home: Activity   Lie down flat or rest for as long as directed by your health care provider.  Return to your normal activities as told by your health care provider. Ask your health care provider what activities are safe for you.  Avoid lifting anything heavier than 10 lb (4.5 kg) for at least 12 hours after the procedure.  Do not drive for 24 hours if you were given a medicine to help you relax (sedative) during your procedure.  Do not drive or use heavy machinery while taking prescription pain medicine. Puncture site care  Remove or change your bandage (dressing) as told by your health care provider.  Check your puncture area every day for signs of infection. Check for: ? More pain. ? Redness or swelling. ? Fluid or blood leaking from the puncture site. ? Warmth. ? Pus or a bad smell. General instructions  Take over-the-counter and prescription medicines only as told by your health care provider.  Drink enough fluids to keep your urine clear or pale yellow. Your health care provider may recommend drinking caffeine to prevent a headache.  Keep all follow-up visits as told by your health care provider. This is important. Contact a health care provider if:  You have fever or chills.  You have nausea or vomiting.  You have a headache that lasts for more than 2 days or does not get better with medicine. Get help right away if:  You develop any of the following in your  legs: ? Weakness. ? Numbness. ? Tingling.  You are unable to control when you urinate or have a bowel movement (incontinence).  You have signs of infection around your puncture site, such as: ? More pain. ? Redness or swelling. ? Fluid or blood leakage. ? Warmth. ? Pus or a bad smell.  You are dizzy or you feel like you might faint.  You have a severe headache, especially when you sit or stand. Summary  A lumbar puncture is a procedure in which a small needle is inserted into the lower back to remove fluid that surrounds the brain and spinal cord.  After this procedure, it is common to have a headache and pain around the needle insertion area.  Lying flat, staying hydrated, and drinking caffeine can help prevent headaches.  Monitor your needle insertion site for signs of infection, including warmth, fluid, or more pain.  Get help right away if you develop leg weakness, leg numbness, incontinence, or severe headaches. This information is not intended to replace advice given to you by your health care provider. Make sure you discuss any questions you have with your health care provider. Document Revised: 06/21/2016 Document Reviewed: 06/21/2016 Elsevier Patient Education  2020 [employer]  

## 2020-03-24 NOTE — Progress Notes (Signed)
Critical WBC in Spinal fluid result of 46 called to Dr Annette Stable.

## 2020-03-24 NOTE — Procedures (Signed)
Diagnostic lumbar puncture performed without complication for requested fluid analysis. Details dictated in Radiology report.

## 2020-03-25 ENCOUNTER — Other Ambulatory Visit (HOSPITAL_COMMUNITY): Payer: Managed Care, Other (non HMO)

## 2020-03-25 LAB — CYTOLOGY - NON PAP

## 2020-03-26 LAB — OLIGOCLONAL BANDS, CSF + SERM

## 2020-04-05 ENCOUNTER — Telehealth: Payer: Self-pay | Admitting: *Deleted

## 2020-04-05 ENCOUNTER — Other Ambulatory Visit: Payer: Self-pay

## 2020-04-05 ENCOUNTER — Encounter: Payer: Self-pay | Admitting: Neurology

## 2020-04-05 ENCOUNTER — Ambulatory Visit (INDEPENDENT_AMBULATORY_CARE_PROVIDER_SITE_OTHER): Payer: Managed Care, Other (non HMO) | Admitting: Neurology

## 2020-04-05 VITALS — BP 119/78 | HR 81 | Ht 72.0 in | Wt 234.5 lb

## 2020-04-05 DIAGNOSIS — G35 Multiple sclerosis: Secondary | ICD-10-CM

## 2020-04-05 DIAGNOSIS — G959 Disease of spinal cord, unspecified: Secondary | ICD-10-CM | POA: Diagnosis not present

## 2020-04-05 DIAGNOSIS — R200 Anesthesia of skin: Secondary | ICD-10-CM | POA: Insufficient documentation

## 2020-04-05 NOTE — Progress Notes (Signed)
[facility]  PATIENT: Benjamin Oliver DOB: 1966-04-16  REFERRING DOCTOR OR PCP:  Carles Collet SOURCE: Patient, notes from neurosurgery, imaging and laboratory reports, MRI images personally reviewed.  _________________________________   HISTORICAL  CHIEF COMPLAINT:  Chief Complaint  Patient presents with  . New Patient (Initial Visit)    RM 13, alone. Paper referral from Earnie Larsson, MD for MS.     HISTORY OF PRESENT ILLNESS:  I had the pleasure of seeing your patient, Benjamin Oliver, at [facility] for neurologic consultation regarding his abnormal MRIs and concerned about multiple sclerosis.  He is a 54 year old man who began to experience foot pain and heel pain over the lasyt few months    He saw podiatry and injections (plantar fasciiitis) helped.   He felt a funny bone paresthetic sensation in the right leg.   He then had a tight sensation in his groin bilaterally (like a wedgie).   His left leg then began to get numb a few days later and he saw his PCP  He had a lumbar MRI.  It showed mild spinal stenosis and lateral recess stenosis to the left at L3-L4.  There could be some effect on the left L4 nerve root.  At L4-L5 there was disc bulging towards the left but no nerve root compression.  Numbness worsened and progressed up to the bilateral axilla.    He saw Dr. Annette Stable of Neurosurgery a couple weeks later.    Due to the worsening symptoms, he ordered an MRI of the thoracic spine which was abnormal showing a lesion at T1-T2 (and also I see a focus at T8 to the right) .     He had a contrasted MRI of the thoracic spine a couple days later.  It showed enhancement of the larger lesion at T1-T2 as well as 2 small enhancing lesions at T5 and T12.  Because of the concern of multiple sclerosis, an MRI of the brain was performed 03/17/2020.  It showed nonspecific foci in the cerebral hemispheres.  There were no infratentorial lesions.  Additionally a lumbar  puncture was performed 03/24/2020.  Greater than 10 oligoclonal bands were present.  There were about 46 WBC/mm3 (93% lymphocytes) present as well.  He is referred for further evaluation of possible multiple sclerosis.  In November 2019, he had right sided facial weakness and was told he had Bell's palsy.  He could close his eye but could not wink and his smile was reduced.  Recovery took 3 months but he still has mild residual weakness.  In 2001, he had left sided Bell's palsy and couldn't close his left eye.   He recovered after a few weeks.     Currently, he notes some numbness in his legs though is 80-90% better.   He has mild flank allodynia and numbness bilaterally.  He had left > right leg weakness such that putting his pants on was difficult.  He is now better.  He had one fall on the stairs but climbs stairs well.  He could easily walk 2-3 miles without a rest.  He notes mild urinary frequency but no change compared to last year.    He notes some fatigue.  He denies issues with mood or cognition.   He snores but has not been noted to have OSA signs.   He is sometimes sleepy in the evenings.     Imaging review (images personally reviewed):  MRI of the thoracic spine without contrast  February 29, 2020 showed a large central lesion at T1-T2 and a small right lateral lesion at T8.    MRI of the thoracic spine with contrast 03/04/2020 showed enhancement of the lesion at T1-T2 more to the left.  There are other small enhancing lesions noted on the sagittal images (but not axial) at T5 and to the left at T11-T12 previous. The focus at T8 did not enhance.     MRI of the brain 03/17/2020 showed scattered T2/flair hyperintense foci.  Most of these were nonspecific in the deep white matter though a couple were in the periventricular white matter.  None of the foci enhanced.  There were no foci in the infratentorial white matter.  Overall, the appearance of these foci were more nonspecific.  MRI of the  lumbar spine 02/04/2020 showed mild spinal stenosis and lateral recess stenosis to the left at L3-L4.  There could be some effect on the left L4 nerve root.  At L4-L5 there was disc bulging towards the left but no nerve root compression.   Laboratory evaluations: He had a lumbar puncture 03/24/2020.  There were greater than 10 oligoclonal bands.  He also had 46 white blood cells per cubic millimeter (93% lymphocytes)  TSH 02/10/2020 was 15.4 consistent with hypothyroidism.    Cholesterol was mildly elevated at 219 with an LDL of 159.  The cholesterol/HDL ratio was 5.2.  Hemoglobin A1c was elevated at 7.  HIV negative.  Vascular Risk Factors:   Mild DM, smoking (1 ppd x 40 years)  REVIEW OF SYSTEMS: Constitutional: No fevers, chills, sweats, or change in appetite Eyes: No visual changes, double vision, eye pain Ear, nose and throat: No hearing loss, ear pain, nasal congestion, sore throat Cardiovascular: No chest pain, palpitations Respiratory: No shortness of breath at rest or with exertion.   No wheezes GastrointestinaI: No nausea, vomiting, diarrhea, abdominal pain, fecal incontinence Genitourinary: No dysuria, urinary retention or frequency.  No nocturia. Musculoskeletal: No neck pain, back pain Integumentary: No rash, pruritus, skin lesions Neurological: as above Psychiatric: No depression at this time.  No anxiety Endocrine: No palpitations, diaphoresis, change in appetite, change in weigh or increased thirst Hematologic/Lymphatic: No anemia, purpura, petechiae. Allergic/Immunologic: No itchy/runny eyes, nasal congestion, recent allergic reactions, rashes  ALLERGIES: No Known Allergies  HOME MEDICATIONS:  Current Outpatient Medications:  .  acetaminophen (TYLENOL) 500 MG tablet, Take 1,000-1,500 mg by mouth every 8 (eight) hours as needed for moderate pain., Disp: , Rfl:  .  Ascorbic Acid (VITAMIN C) 1000 MG tablet, Take 1,000 mg by mouth daily., Disp: , Rfl:  .  calcium  carbonate (TUMS) 500 MG chewable tablet, Chew 1,000-1,500 mg by mouth daily as needed for indigestion or heartburn., Disp: , Rfl:  .  cyclobenzaprine (FLEXERIL) 10 MG tablet, Take 1 tablet (10 mg total) by mouth 3 (three) times daily as needed for muscle spasms., Disp: 30 tablet, Rfl: 0 .  diazepam (VALIUM) 5 MG tablet, Take one tablet an hour before procedure., Disp: 5 tablet, Rfl: 0 .  ibuprofen (ADVIL) 200 MG tablet, Take 400-800 mg by mouth every 6 (six) hours as needed for moderate pain., Disp: , Rfl:  .  levothyroxine (SYNTHROID) 75 MCG tablet, Take 1 tablet (75 mcg total) by mouth daily., Disp: 30 tablet, Rfl: 12 .  loratadine (CLARITIN) 10 MG tablet, Take 10 mg by mouth daily as needed for allergies. , Disp: , Rfl:  .  meloxicam (MOBIC) 15 MG tablet, Take 1 tablet (15 mg total) by mouth  daily., Disp: 30 tablet, Rfl: 1 .  Multiple Vitamin (MULTIVITAMIN WITH MINERALS) TABS tablet, Take 1 tablet by mouth daily., Disp: , Rfl:  .  VITAMIN D PO, Take 1 capsule by mouth daily., Disp: , Rfl:  .  Diroximel Fumarate (VUMERITY PO), Take by mouth. Titration: 231mg x1 po BID x7 days then 462mg  po BID x23 days Maintenance: 462mg  po BID, Disp: , Rfl:   PAST MEDICAL HISTORY: History reviewed. No pertinent past medical history.  PAST SURGICAL HISTORY: Past Surgical History:  Procedure Laterality Date  . LEG SURGERY Left 1984  . WRIST SURGERY Right     FAMILY HISTORY: Family History  Problem Relation Age of Onset  . Cancer Brother        lung cancer    SOCIAL HISTORY:  Social History   Socioeconomic History  . Marital status: Married    Spouse name: Not on file  . Number of children: Not on file  . Years of education: Not on file  . Highest education level: Not on file  Occupational History  . Not on file  Tobacco Use  . Smoking status: Current Every Day Smoker    Packs/day: 1.00    Types: Cigarettes  . Smokeless tobacco: Never Used  Substance and Sexual Activity  . Alcohol use:  Yes    Comment: occassionally- very little  . Drug use: Never  . Sexual activity: Yes  Other Topics Concern  . Not on file  Social History Narrative   Right handed   Lives with wife.   Caffeine use: coffee daily (black)   Social Determinants of Health   Financial Resource Strain:   . Difficulty of Paying Living Expenses: Not on file  Food Insecurity:   . Worried About Charity fundraiser in the Last Year: Not on file  . Ran Out of Food in the Last Year: Not on file  Transportation Needs:   . Lack of Transportation (Medical): Not on file  . Lack of Transportation (Non-Medical): Not on file  Physical Activity:   . Days of Exercise per Week: Not on file  . Minutes of Exercise per Session: Not on file  Stress:   . Feeling of Stress : Not on file  Social Connections:   . Frequency of Communication with Friends and Family: Not on file  . Frequency of Social Gatherings with Friends and Family: Not on file  . Attends Religious Services: Not on file  . Active Member of Clubs or Organizations: Not on file  . Attends Archivist Meetings: Not on file  . Marital Status: Not on file  Intimate Partner Violence:   . Fear of Current or Ex-Partner: Not on file  . Emotionally Abused: Not on file  . Physically Abused: Not on file  . Sexually Abused: Not on file     PHYSICAL EXAM  Vitals:   04/05/20 0824  BP: 119/78  Pulse: 81  Weight: 234 lb 8 oz (106.4 kg)  Height: 6' (1.829 m)    Body mass index is 31.8 kg/m.   General: The patient is well-developed and well-nourished and in no acute distress  HEENT:  Head is [place]/AT.  Sclera are anicteric.  Funduscopic exam shows normal optic discs and retinal vessels.  Neck: No carotid bruits are noted.  The neck is nontender.  Cardiovascular: The heart has a regular rate and rhythm with a normal S1 and S2. There were no murmurs, gallops or rubs.    Skin: Extremities are without rash  or  edema.  Musculoskeletal:  Back is  nontender  Neurologic Exam  Mental status: The patient is alert and oriented x 3 at the time of the examination. The patient has apparent normal recent and remote memory, with an apparently normal attention span and concentration ability.   Speech is normal.  Cranial nerves: Extraocular movements are full. Pupils are equal, round, and reactive to light and accomodation.  Visual fields are full.  Facial symmetry is present. There is good facial sensation to soft touch bilaterally.Facial strength is normal.  Trapezius and sternocleidomastoid strength is normal. No dysarthria is noted.  The tongue is midline, and the patient has symmetric elevation of the soft palate. No obvious hearing deficits are noted.  Motor:  Muscle bulk is normal.   Tone is normal. Strength is  5 / 5 in all 4 extremities.   Sensory: He reports mild reduced sensation to temperature in the flanks of the abdomen and chest and mildly altered sensation in the toes with normal sensation elsewhere..  Vibration sensation was normal and symmetric.  Coordination: Cerebellar testing reveals good finger-nose-finger and heel-to-shin bilaterally.  Gait and station: Station is normal.   Gait is normal. Tandem gait is normal for age. Romberg is negative.   Reflexes: Deep tendon reflexes are symmetric and normal bilaterally.   Plantar responses are flexor.    DIAGNOSTIC DATA (LABS, IMAGING, TESTING) - I reviewed patient records, labs, notes, testing and imaging myself where available.  Lab Results  Component Value Date   WBC 11.0 (H) 02/10/2020   HGB 16.4 02/10/2020   HCT 47.8 02/10/2020   MCV 94 02/10/2020   PLT 261 02/10/2020      Component Value Date/Time   NA 139 02/10/2020 1127   NA 135 (L) 10/10/2012 1120   K 4.5 02/10/2020 1127   K 5.5 (H) 10/10/2012 1120   CL 100 02/10/2020 1127   CL 106 10/10/2012 1120   CO2 20 02/10/2020 1127   CO2 24 10/10/2012 1120   GLUCOSE 118 (H) 02/10/2020 1127   GLUCOSE 93 10/10/2012  1120   BUN 14 02/10/2020 1127   BUN 11 10/10/2012 1120   CREATININE 0.91 02/10/2020 1127   CREATININE 0.85 10/10/2012 1120   CALCIUM 9.5 02/10/2020 1127   CALCIUM 8.5 10/10/2012 1120   PROT 7.7 02/10/2020 1127   PROT 7.6 10/10/2012 1120   ALBUMIN 4.7 02/10/2020 1127   ALBUMIN 3.6 10/10/2012 1120   AST 22 02/10/2020 1127   AST 50 (H) 10/10/2012 1120   ALT 44 02/10/2020 1127   ALT 43 10/10/2012 1120   ALKPHOS 82 02/10/2020 1127   ALKPHOS 70 10/10/2012 1120   BILITOT 0.4 02/10/2020 1127   BILITOT 0.5 10/10/2012 1120   GFRNONAA 95 02/10/2020 1127   GFRNONAA >60 10/10/2012 1120   GFRAA 109 02/10/2020 1127   GFRAA >60 10/10/2012 1120   Lab Results  Component Value Date   CHOL 219 (H) 02/10/2020   HDL 42 02/10/2020   LDLCALC 159 (H) 02/10/2020   TRIG 101 02/10/2020   CHOLHDL 5.2 (H) 02/10/2020   Lab Results  Component Value Date   HGBA1C 7.1 (H) 02/10/2020   No results found for: [id number] Lab Results  Component Value Date   TSH 15.400 (H) 02/10/2020       ASSESSMENT AND PLAN  Multiple sclerosis (HCC)  Spinal cord lesion (HCC)  Numbness    In summary, Benjamin Oliver is a 54 year old man who had numbness up to the upper chest and mild weakness  in the left leg that developed over 1 to 2 weeks about 2 months ago.  He is currently doing much better and feels he is 85% improved.  He still has mild sensory symptoms but normal strength and coordination.  We discussed that the combination of his symptoms, MRI showing multiple acute and chronic spinal cord lesions and CSF results are consistent with relapsing remitting MS.  Therefore, I would like to get him started on a disease modifying therapy.  We discussed some options and I will initiate Vumerity.  We discussed that GI symptoms and flushing may occur and that he will need to have blood work every 6 months while on the medication.    We also discussed the symptoms that might occur if he has an exacerbation.  He works as an  Clinical biochemist.  I feel he is now safe to return to work and wrote a note in that regard.  He will return to see me in 4 months or sooner if he has new or worsening neurologic symptoms.  He will let us know if he has any trouble getting started on the medication for symptoms when he takes it.  Thank you for asking to see Benjamin Oliver.  Please let him know when we have further assistance with him or other patients in the future.     Errika Narvaiz A. Felecia Shelling, MD, [facility] 54/[id number]/2641, 58:30 PM Certified in Neurology, Clinical Neurophysiology, Sleep Medicine and Neuroimaging  [facility] [address] [place], [place] [address] [phone]

## 2020-04-05 NOTE — Telephone Encounter (Signed)
Faxed completed/signed Vumerity start form to [place] at [phone]. Received fax confirmation.  If vumerity is not covered by pt insurance, Dr. Felecia Shelling recommends dimethyl fumarate (generic) instead.

## 2020-04-06 ENCOUNTER — Other Ambulatory Visit: Payer: Self-pay | Admitting: Podiatry

## 2020-04-06 NOTE — Telephone Encounter (Signed)
Submitted PA for vumerity 231mg  capsules via [facility]MM to [employer]. Should have a determination in 1-3 business days. Key: [id number]

## 2020-04-07 ENCOUNTER — Encounter: Payer: Self-pay | Admitting: Podiatry

## 2020-04-07 ENCOUNTER — Ambulatory Visit (INDEPENDENT_AMBULATORY_CARE_PROVIDER_SITE_OTHER): Payer: Managed Care, Other (non HMO) | Admitting: Podiatry

## 2020-04-07 ENCOUNTER — Other Ambulatory Visit: Payer: Self-pay

## 2020-04-07 DIAGNOSIS — M722 Plantar fascial fibromatosis: Secondary | ICD-10-CM | POA: Diagnosis not present

## 2020-04-07 MED ORDER — MELOXICAM 15 MG PO TABS: 15.0000 mg | ORAL_TABLET | Freq: Every day | ORAL | 1 refills | Status: AC

## 2020-04-07 NOTE — Progress Notes (Signed)
  Subjective:  Patient ID: Benjamin Oliver, male    DOB: 12/02/1965,  MRN: [id number]  Chief Complaint  Patient presents with  . Plantar Fasciitis    flare up plantar fasciitis bilat heels x 1 week    54 y.o. male presents with the above complaint. History confirmed with patient.  Still having pain in both heels.  He has been seeing Dr. Amalia Hailey.  He was supposed to pick up orthotics today but they are not in yet.  He has not been doing any stretches.  Objective:  Physical Exam: warm, good capillary refill, no trophic changes or ulcerative lesions, normal DP and PT pulses and normal sensory exam. Left Foot: point tenderness over the heel pad  Right Foot: point tenderness over the heel pad   Assessment:   1. Plantar fasciitis of right foot   2. Plantar fasciitis of left foot      Plan:  Patient was evaluated and treated and all questions answered.  Discussed the etiology and treatment options for plantar fasciitis including stretching, formal physical therapy, supportive shoegears such as a running shoe or sneaker, pre fabricated orthoses, injection therapy, and oral medications. We also discussed the role of surgical treatment of this for patients who do not improve after exhausting non-surgical treatment options.  -Strongly encourage stretching exercises today and these were given to him.  Advised him that if he is not improving by next visit we should consider physical therapy   After sterile prep with povidone-iodine solution and alcohol, the bilateral heel was injected with 0.5cc 2% xylocaine plain, 0.5cc 0.5% marcaine plain, 5mg  triamcinolone acetonide, and 2mg  dexamethasone was injected along  the plantar fascia at the insertion on the plantar calcaneus. The patient tolerated the procedure well without complication.     Return in about 6 weeks (around 05/19/2020).

## 2020-04-09 NOTE — Telephone Encounter (Signed)
Received this notice from [employer]: "PA has already submitted and is in process for this patient and drug.;ZCKICH:[id number];CYOYOO:In Process;"

## 2020-04-09 NOTE — Telephone Encounter (Signed)
Received another PA request for Vumerity. Key: [id number]. Sent to [employer]. Waiting on clinical questions.

## 2020-04-12 MED ORDER — DIMETHYL FUMARATE 240 MG PO CPDR: 240.0000 mg | DELAYED_RELEASE_CAPSULE | Freq: Two times a day (BID) | ORAL | 5 refills | Status: DC

## 2020-04-12 MED ORDER — DIMETHYL FUMARATE 120 MG PO CPDR: 120.0000 mg | DELAYED_RELEASE_CAPSULE | Freq: Two times a day (BID) | ORAL | 0 refills | Status: DC

## 2020-04-12 NOTE — Telephone Encounter (Addendum)
Received this notice from CMM: "CaseId:[id number];Status:Denied;Review Type:Prior Auth;Appeal Information: [place] [id number]; Important - Please read the below note on eAppeals: Please reference the denial letter for information on the rights for an appeal, rationale for the denial, and how to submit an appeal including if any information is needed to support the appeal. Note about urgent situations - Generally, an urgent situation is one which, in the opinion of the provider, the health of the patient may be in serious jeopardy or may experience pain that cannot be adequately controlled while waiting for a decision on the appeal."

## 2020-04-12 NOTE — Telephone Encounter (Signed)
I spoke with Dr. Felecia Shelling . He would prefer trying pt on dimethyl fumarate before appealing the vumerity.  I called pt to discuss. No answer, left a message asking him to call me back.

## 2020-04-12 NOTE — Addendum Note (Signed)
Addended by: Lester [place] A on: 04/12/2020 02:09 PM   Modules accepted: Orders

## 2020-04-12 NOTE — Telephone Encounter (Signed)
Pt returned my call. He is aware that vumerity was denied by his insurance and has spoken to them regarding this. I advised him that Dr. Felecia Shelling would prefer he try dimethyl fumarate to see if that is affordable and tolerated well before appealing the vumerity. We will send to [facility]. Pt verbalized understanding and appreciation.

## 2020-04-13 ENCOUNTER — Other Ambulatory Visit: Payer: Self-pay

## 2020-04-13 ENCOUNTER — Telehealth: Payer: Self-pay | Admitting: Physician Assistant

## 2020-04-13 ENCOUNTER — Encounter: Payer: Self-pay | Admitting: Physician Assistant

## 2020-04-13 ENCOUNTER — Ambulatory Visit: Payer: Managed Care, Other (non HMO) | Admitting: Physician Assistant

## 2020-04-13 ENCOUNTER — Other Ambulatory Visit: Payer: Self-pay | Admitting: *Deleted

## 2020-04-13 VITALS — BP 126/88 | HR 70 | Temp 98.6°F | Wt 235.9 lb

## 2020-04-13 DIAGNOSIS — E039 Hypothyroidism, unspecified: Secondary | ICD-10-CM | POA: Diagnosis not present

## 2020-04-13 DIAGNOSIS — G35 Multiple sclerosis: Secondary | ICD-10-CM

## 2020-04-13 DIAGNOSIS — E1165 Type 2 diabetes mellitus with hyperglycemia: Secondary | ICD-10-CM

## 2020-04-13 DIAGNOSIS — E1169 Type 2 diabetes mellitus with other specified complication: Secondary | ICD-10-CM

## 2020-04-13 DIAGNOSIS — E785 Hyperlipidemia, unspecified: Secondary | ICD-10-CM

## 2020-04-13 MED ORDER — ATORVASTATIN CALCIUM 10 MG PO TABS: 10.0000 mg | ORAL_TABLET | Freq: Every day | ORAL | 0 refills | Status: DC

## 2020-04-13 MED ORDER — METFORMIN HCL ER 500 MG PO TB24: 500.0000 mg | ORAL_TABLET | Freq: Every day | ORAL | 0 refills | Status: DC

## 2020-04-13 NOTE — Telephone Encounter (Signed)
Please review. Thanks!  

## 2020-04-13 NOTE — Telephone Encounter (Signed)
Patient called to ask if the doctor has called in his medication from his visit today.  Patient said the pharmacy did not have a script for any medications.  Please advise and call patient to give an update at [phone]

## 2020-04-13 NOTE — Telephone Encounter (Signed)
Patient was advised medication was send into pharmacy.  Spoke to [place] at [facility] in [place] and she states the [facility] filled medication and it was picked up. [place] and they states that the medication was filled but not picked up.

## 2020-04-13 NOTE — Telephone Encounter (Signed)
Sent in medications to [place].

## 2020-04-13 NOTE — Telephone Encounter (Signed)
[place] was advised medication need to be filled at that location.

## 2020-04-13 NOTE — Progress Notes (Signed)
Established patient visit   Patient: Benjamin Oliver   DOB: July 24, 1965   54 y.o. Male  MRN: [id number] Visit Date: 04/13/2020  Today's healthcare provider: Trinna Post, PA-C   Chief Complaint  Patient presents with  . Hyperlipidemia  . Hypothyroidism  I,Deshayla Empson M Audriella Blakeley,acting as a scribe for [employer], PA-C.,have documented all relevant documentation on the behalf of Trinna Post, PA-C,as directed by  Trinna Post, PA-C while in the presence of Trinna Post, PA-C.  Subjective    HPI   In the interim, patient has been diagnosed with MS. A couple of months ago he had an episode where he had sudden onset numbness, tingling and weakness in his lower extremities. Lumbar MRI unremarkable for cauda equina or other pathology. He was seen by Dr. Trenton Gammon at [facility] and underwent thoracic and brain MRI. There was at one point concern for malignant process but ultimately this workup was negative. His brain MRI showed some white matter changes and he was referred to neurology where he was diagnosed with multiple sclerosis. He has recently seen Dr. Felecia Shelling and is being recommended to try Vumerity, however his insurance is currently denying payment.   Hypothyroid, follow-up  Lab Results  Component Value Date   TSH 15.400 (H) 02/10/2020   TSH 9.600 (H) 05/31/2018   TSH 18.490 (H) 09/11/2017   FREET4 0.74 (L) 09/11/2017   T4TOTAL 6.4 05/31/2018   Wt Readings from Last 3 Encounters:  04/13/20 235 lb 14.4 oz (107 kg)  04/05/20 234 lb 8 oz (106.4 kg)  02/10/20 232 lb 6.4 oz (105.4 kg)    He was last seen for hypothyroid 2 months ago.  Management since that visit includes . He reports poor compliance with treatment. He is not having side effects.   Symptoms: No change in energy level No constipation  No diarrhea No heat / cold intolerance  No nervousness No palpitations  No weight changes     ----------------------------------------------------------------------------------------- Lipid/Cholesterol, Follow-up  Last lipid panel Other pertinent labs  Lab Results  Component Value Date   CHOL 219 (H) 02/10/2020   HDL 42 02/10/2020   LDLCALC 159 (H) 02/10/2020   TRIG 101 02/10/2020   CHOLHDL 5.2 (H) 02/10/2020   Lab Results  Component Value Date   ALT 44 02/10/2020   AST 22 02/10/2020   PLT 261 02/10/2020   TSH 15.400 (H) 02/10/2020     He was last seen for this 4 months ago.   Symptoms: No chest pain No chest pressure/discomfort  No dyspnea No lower extremity edema  No numbness or tingling of extremity No orthopnea  No palpitations No paroxysmal nocturnal dyspnea  No speech difficulty No syncope   Current diet: well balanced Current exercise: aerobics  The 10-year ASCVD risk score [name], et al., 2013) is: 23.5%  ---------------------------------------------------------------------------------------------------  Diabetes Mellitus Type II, Follow-up  Lab Results  Component Value Date   HGBA1C 8.2 (H) 04/13/2020   HGBA1C 7.1 (H) 02/10/2020   Wt Readings from Last 3 Encounters:  04/13/20 235 lb 14.4 oz (107 kg)  04/05/20 234 lb 8 oz (106.4 kg)  02/10/20 232 lb 6.4 oz (105.4 kg)   Last seen for diabetes 6 weeks ago.  Symptoms: No fatigue No foot ulcerations  No appetite changes No nausea  No paresthesia of the feet  No polydipsia  No polyuria No visual disturbances   No vomiting     Home blood sugar records: not checked,  new diagnosis.   Episodes of hypoglycemia? No    Current insulin regiment: none Most Recent Eye Exam: referral today Current exercise: none Current diet habits: well balanced  Pertinent Labs: Lab Results  Component Value Date   CHOL 219 (H) 02/10/2020   HDL 42 02/10/2020   LDLCALC 159 (H) 02/10/2020   TRIG 101 02/10/2020   CHOLHDL 5.2 (H) 02/10/2020   Lab Results  Component Value Date   NA 139 02/10/2020   K  4.5 02/10/2020   CREATININE 0.91 02/10/2020   GFRNONAA 95 02/10/2020   GFRAA 109 02/10/2020   GLUCOSE 118 (H) 02/10/2020     ---------------------------------------------------------------------------------------------------         Medications: Outpatient Medications Prior to Visit  Medication Sig  . acetaminophen (TYLENOL) 500 MG tablet Take 1,000-1,500 mg by mouth every 8 (eight) hours as needed for moderate pain.  . Ascorbic Acid (VITAMIN C) 1000 MG tablet Take 1,000 mg by mouth daily.  . calcium carbonate (TUMS) 500 MG chewable tablet Chew 1,000-1,500 mg by mouth daily as needed for indigestion or heartburn.  . diazepam (VALIUM) 5 MG tablet Take one tablet an hour before procedure.  Marland Kitchen ibuprofen (ADVIL) 200 MG tablet Take 400-800 mg by mouth every 6 (six) hours as needed for moderate pain.  Marland Kitchen loratadine (CLARITIN) 10 MG tablet Take 10 mg by mouth daily as needed for allergies.   . meloxicam (MOBIC) 15 MG tablet Take 1 tablet (15 mg total) by mouth daily.  . Multiple Vitamin (MULTIVITAMIN WITH MINERALS) TABS tablet Take 1 tablet by mouth daily.  Marland Kitchen VITAMIN D PO Take 1 capsule by mouth daily.  . cyclobenzaprine (FLEXERIL) 10 MG tablet Take 1 tablet (10 mg total) by mouth 3 (three) times daily as needed for muscle spasms. (Patient not taking: Reported on 04/13/2020)  . Dimethyl Fumarate 120 MG CPDR Take 120 mg by mouth 2 (two) times daily. (Patient not taking: Reported on 04/13/2020)  . Dimethyl Fumarate 240 MG CPDR Take 1 capsule (240 mg total) by mouth 2 (two) times daily. (Patient not taking: Reported on 04/13/2020)  . levothyroxine (SYNTHROID) 75 MCG tablet Take 1 tablet (75 mcg total) by mouth daily. (Patient not taking: Reported on 04/13/2020)   No facility-administered medications prior to visit.    Review of Systems  Constitutional: Negative.   Respiratory: Negative.   Cardiovascular: Negative.   Hematological: Negative.       Objective    BP 126/88 (BP Location:  Left Arm, Patient Position: Sitting, Cuff Size: Large)   Pulse 70   Temp 98.6 F (37 C) (Oral)   Wt 235 lb 14.4 oz (107 kg)   SpO2 98%   BMI 31.99 kg/m    Physical Exam Constitutional:      Appearance: Normal appearance. He is obese.  Cardiovascular:     Rate and Rhythm: Normal rate and regular rhythm.     Heart sounds: Normal heart sounds.  Pulmonary:     Effort: Pulmonary effort is normal.     Breath sounds: Normal breath sounds.  Skin:    General: Skin is warm and dry.  Neurological:     General: No focal deficit present.     Mental Status: He is alert and oriented to person, place, and time. Mental status is at baseline.  Psychiatric:        Mood and Affect: Mood normal.        Behavior: Behavior normal.       No results found for any visits  on 04/13/20.  Assessment & Plan    1. Type 2 diabetes mellitus with hyperglycemia, without long-term current use of insulin ([place])  New diagnosis. Patient has reduced mountain dews and sweetened snacks. We will start him on metformin 500 mg daily and titrate up to max dose. Follow up in 6 weeks.   - HgB A1c - Ambulatory referral to Ophthalmology - Urine Microalbumin w/creat. ratio - atorvastatin (LIPITOR) 10 MG tablet; Take 1 tablet (10 mg total) by mouth daily.  Dispense: 90 tablet; Refill: 0  2. Multiple sclerosis ([place])  Followed by Dr. Felecia Shelling at [facility]. Considering treatment currently.   3. Hypothyroidism, unspecified type  Stabilized, continue synthroid.   - TSH  4. Hyperlipidemia associated with type 2 diabetes mellitus ([place])  Start statin.    No follow-ups on file.      ITrinna Post, PA-C, have reviewed all documentation for this visit. The documentation on 04/13/20 for the exam, diagnosis, procedures, and orders are all accurate and complete.  The entirety of the information documented in the History of Present Illness, Review of Systems and Physical Exam were personally  obtained by me. Portions of this information were initially documented by [facility] and reviewed by me for thoroughness and accuracy.     Paulene Floor  [facility] [phone] (phone) [phone] (fax)  [place]

## 2020-04-13 NOTE — Patient Instructions (Signed)
Diabetes Mellitus and Exercise Exercising regularly is important for your overall health, especially when you have diabetes (diabetes mellitus). Exercising is not only about losing weight. It has many other health benefits, such as increasing muscle strength and bone density and reducing body fat and stress. This leads to improved fitness, flexibility, and endurance, all of which result in better overall health. Exercise has additional benefits for people with diabetes, including:  Reducing appetite.  Helping to lower and control blood glucose.  Lowering blood pressure.  Helping to control amounts of fatty substances (lipids) in the blood, such as cholesterol and triglycerides.  Helping the body to respond better to insulin (improving insulin sensitivity).  Reducing how much insulin the body needs.  Decreasing the risk for heart disease by: ? Lowering cholesterol and triglyceride levels. ? Increasing the levels of good cholesterol. ? Lowering blood glucose levels. What is my activity plan? Your health care provider or certified diabetes educator can help you make a plan for the type and frequency of exercise (activity plan) that works for you. Make sure that you:  Do at least 150 minutes of moderate-intensity or vigorous-intensity exercise each week. This could be brisk walking, biking, or water aerobics. ? Do stretching and strength exercises, such as yoga or weightlifting, at least 2 times a week. ? Spread out your activity over at least 3 days of the week.  Get some form of physical activity every day. ? Do not go more than 2 days in a row without some kind of physical activity. ? Avoid being inactive for more than 30 minutes at a time. Take frequent breaks to walk or stretch.  Choose a type of exercise or activity that you enjoy, and set realistic goals.  Start slowly, and gradually increase the intensity of your exercise over time. What do I need to know about managing my  diabetes?   Check your blood glucose before and after exercising. ? If your blood glucose is 240 mg/dL (13.3 mmol/L) or higher before you exercise, check your urine for ketones. If you have ketones in your urine, do not exercise until your blood glucose returns to normal. ? If your blood glucose is 100 mg/dL (5.6 mmol/L) or lower, eat a snack containing 15-20 grams of carbohydrate. Check your blood glucose 15 minutes after the snack to make sure that your level is above 100 mg/dL (5.6 mmol/L) before you start your exercise.  Know the symptoms of low blood glucose (hypoglycemia) and how to treat it. Your risk for hypoglycemia increases during and after exercise. Common symptoms of hypoglycemia can include: ? Hunger. ? Anxiety. ? Sweating and feeling clammy. ? Confusion. ? Dizziness or feeling light-headed. ? Increased heart rate or palpitations. ? Blurry vision. ? Tingling or numbness around the mouth, lips, or tongue. ? Tremors or shakes. ? Irritability.  Keep a rapid-acting carbohydrate snack available before, during, and after exercise to help prevent or treat hypoglycemia.  Avoid injecting insulin into areas of the body that are going to be exercised. For example, avoid injecting insulin into: ? The arms, when playing tennis. ? The legs, when jogging.  Keep records of your exercise habits. Doing this can help you and your health care provider adjust your diabetes management plan as needed. Write down: ? Food that you eat before and after you exercise. ? Blood glucose levels before and after you exercise. ? The type and amount of exercise you have done. ? When your insulin is expected to peak, if you use   insulin. Avoid exercising at times when your insulin is peaking.  When you start a new exercise or activity, work with your health care provider to make sure the activity is safe for you, and to adjust your insulin, medicines, or food intake as needed.  Drink plenty of water while  you exercise to prevent dehydration or heat stroke. Drink enough fluid to keep your urine clear or pale yellow. Summary  Exercising regularly is important for your overall health, especially when you have diabetes (diabetes mellitus).  Exercising has many health benefits, such as increasing muscle strength and bone density and reducing body fat and stress.  Your health care provider or certified diabetes educator can help you make a plan for the type and frequency of exercise (activity plan) that works for you.  When you start a new exercise or activity, work with your health care provider to make sure the activity is safe for you, and to adjust your insulin, medicines, or food intake as needed. This information is not intended to replace advice given to you by your health care provider. Make sure you discuss any questions you have with your health care provider. Document Revised: 11/30/2016 Document Reviewed: 10/18/2015 Elsevier Patient Education  2020 Elsevier Inc.  

## 2020-04-13 NOTE — Telephone Encounter (Signed)
Received this notice from [place]: "Thank you for your Rx for Mr. Benjamin Oliver DOB: 09/25/1965.  [facility] is out of network for the Dimethyl Fumarate Rx.  I faxed the Rx to [facility].  They are the in network SP. "

## 2020-04-13 NOTE — Telephone Encounter (Signed)
Faxed completed/signed PA to [facility] for both titration and maintenance dose of dimethyl fumarate. Fax: [phone]. Received fax confirmation. Marked urgent. Waiting on determination.

## 2020-04-14 ENCOUNTER — Encounter: Payer: Self-pay | Admitting: Orthotics

## 2020-04-14 ENCOUNTER — Ambulatory Visit: Payer: Managed Care, Other (non HMO) | Admitting: Orthotics

## 2020-04-14 DIAGNOSIS — M722 Plantar fascial fibromatosis: Secondary | ICD-10-CM

## 2020-04-14 LAB — HEMOGLOBIN A1C
Est. average glucose Bld gHb Est-mCnc: 189 mg/dL
Hgb A1c MFr Bld: 8.2 % — ABNORMAL HIGH (ref 4.8–5.6)

## 2020-04-14 LAB — TSH: TSH: 3.62 u[IU]/mL (ref 0.450–4.500)

## 2020-04-14 LAB — MICROALBUMIN / CREATININE URINE RATIO
Creatinine, Urine: 65.5 mg/dL
Microalb/Creat Ratio: <5 mg/g creat (ref 0–29)
Microalbumin, Urine: <3 ug/mL

## 2020-04-14 MED ORDER — DIMETHYL FUMARATE 120 MG PO CPDR: 120.0000 mg | DELAYED_RELEASE_CAPSULE | Freq: Two times a day (BID) | ORAL | 0 refills | Status: DC

## 2020-04-14 MED ORDER — DIMETHYL FUMARATE 240 MG PO CPDR
240.0000 mg | DELAYED_RELEASE_CAPSULE | Freq: Two times a day (BID) | ORAL | 5 refills | Status: DC
Start: 2020-04-14 — End: 2020-09-30

## 2020-04-14 NOTE — Addendum Note (Signed)
Addended by: Lester [place] A on: 04/14/2020 01:20 PM   Modules accepted: Orders

## 2020-04-14 NOTE — Telephone Encounter (Signed)
I called [facility]. I advised them of this approval for dimethyl fumarate. They will begin processing.  I called pt to advise him of this. No answer, left a message with this information on cell phone per DPR advising him of this information and asked him to call me back with questions or concerns.

## 2020-04-14 NOTE — Patient Instructions (Signed)

## 2020-04-14 NOTE — Telephone Encounter (Signed)
Received fax from [place] that PA dimethyl fumarate approved 04/14/20-04/14/21. Request ID: [id number].

## 2020-04-14 NOTE — Progress Notes (Signed)

## 2020-04-20 ENCOUNTER — Other Ambulatory Visit
Admission: RE | Admit: 2020-04-20 | Discharge: 2020-04-20 | Disposition: A | Discharge status: Home / Self Care | Payer: Managed Care, Other (non HMO) | Source: Ambulatory Visit | Attending: Gastroenterology | Admitting: Gastroenterology

## 2020-04-20 ENCOUNTER — Other Ambulatory Visit: Payer: Self-pay

## 2020-04-20 DIAGNOSIS — Z20822 Contact with and (suspected) exposure to covid-19: Secondary | ICD-10-CM | POA: Diagnosis not present

## 2020-04-20 DIAGNOSIS — Z01812 Encounter for preprocedural laboratory examination: Secondary | ICD-10-CM | POA: Diagnosis present

## 2020-04-20 LAB — SARS CORONAVIRUS 2 (TAT 6-24 HRS): SARS Coronavirus 2: NEGATIVE

## 2020-04-22 ENCOUNTER — Ambulatory Visit
Admission: RE | Admit: 2020-04-22 | Payer: Managed Care, Other (non HMO) | Source: Home / Self Care | Admitting: Gastroenterology

## 2020-04-22 ENCOUNTER — Encounter: Admission: RE | Payer: Self-pay | Source: Home / Self Care

## 2020-04-22 ENCOUNTER — Encounter
Admission: RE | Disposition: A | Discharge status: Home / Self Care | Payer: Self-pay | Source: Home / Self Care | Attending: Gastroenterology

## 2020-04-22 ENCOUNTER — Other Ambulatory Visit: Payer: Self-pay

## 2020-04-22 ENCOUNTER — Ambulatory Visit: Payer: Managed Care, Other (non HMO) | Admitting: Anesthesiology

## 2020-04-22 ENCOUNTER — Ambulatory Visit
Admission: RE | Admit: 2020-04-22 | Discharge: 2020-04-22 | Disposition: A | Discharge status: Home / Self Care | Payer: Managed Care, Other (non HMO) | Attending: Gastroenterology | Admitting: Gastroenterology

## 2020-04-22 ENCOUNTER — Encounter: Payer: Self-pay | Admitting: Gastroenterology

## 2020-04-22 DIAGNOSIS — K621 Rectal polyp: Secondary | ICD-10-CM | POA: Diagnosis not present

## 2020-04-22 DIAGNOSIS — D123 Benign neoplasm of transverse colon: Secondary | ICD-10-CM | POA: Diagnosis not present

## 2020-04-22 DIAGNOSIS — Z79890 Hormone replacement therapy (postmenopausal): Secondary | ICD-10-CM | POA: Diagnosis not present

## 2020-04-22 DIAGNOSIS — D124 Benign neoplasm of descending colon: Secondary | ICD-10-CM | POA: Insufficient documentation

## 2020-04-22 DIAGNOSIS — Z8371 Family history of colonic polyps: Secondary | ICD-10-CM | POA: Diagnosis not present

## 2020-04-22 DIAGNOSIS — K635 Polyp of colon: Secondary | ICD-10-CM | POA: Diagnosis not present

## 2020-04-22 DIAGNOSIS — Z1211 Encounter for screening for malignant neoplasm of colon: Secondary | ICD-10-CM

## 2020-04-22 DIAGNOSIS — Z791 Long term (current) use of non-steroidal anti-inflammatories (NSAID): Secondary | ICD-10-CM | POA: Insufficient documentation

## 2020-04-22 DIAGNOSIS — K640 First degree hemorrhoids: Secondary | ICD-10-CM | POA: Diagnosis not present

## 2020-04-22 DIAGNOSIS — F17210 Nicotine dependence, cigarettes, uncomplicated: Secondary | ICD-10-CM | POA: Diagnosis not present

## 2020-04-22 DIAGNOSIS — Z7984 Long term (current) use of oral hypoglycemic drugs: Secondary | ICD-10-CM | POA: Diagnosis not present

## 2020-04-22 DIAGNOSIS — Z79899 Other long term (current) drug therapy: Secondary | ICD-10-CM | POA: Insufficient documentation

## 2020-04-22 HISTORY — DX: Hypothyroidism, unspecified: E03.9

## 2020-04-22 HISTORY — PX: COLONOSCOPY WITH PROPOFOL: SHX5780

## 2020-04-22 LAB — GLUCOSE, CAPILLARY: Glucose-Capillary: 122 mg/dL — ABNORMAL HIGH (ref 70–99)

## 2020-04-22 SURGERY — COLONOSCOPY WITH PROPOFOL
Anesthesia: General

## 2020-04-22 MED ORDER — PROPOFOL 500 MG/50ML IV EMUL
INTRAVENOUS | Status: DC | PRN
  Administered 2020-04-22: 130 ug/kg/min via INTRAVENOUS

## 2020-04-22 MED ORDER — LIDOCAINE HCL (CARDIAC) PF 100 MG/5ML IV SOSY
PREFILLED_SYRINGE | INTRAVENOUS | Status: DC | PRN
  Administered 2020-04-22: 50 mg via INTRAVENOUS

## 2020-04-22 MED ORDER — PROPOFOL 10 MG/ML IV BOLUS
INTRAVENOUS | Status: DC | PRN
  Administered 2020-04-22: 100 mg via INTRAVENOUS

## 2020-04-22 MED ORDER — LIDOCAINE HCL (PF) 2 % IJ SOLN
INTRAMUSCULAR | Status: AC
  Filled 2020-04-22: qty 5

## 2020-04-22 MED ORDER — SODIUM CHLORIDE 0.9 % IV SOLN
INTRAVENOUS | Status: DC
  Administered 2020-04-22: 1000 mL via INTRAVENOUS

## 2020-04-22 MED ORDER — PROPOFOL 500 MG/50ML IV EMUL
INTRAVENOUS | Status: AC
  Filled 2020-04-22: qty 50

## 2020-04-22 NOTE — Anesthesia Postprocedure Evaluation (Signed)
Anesthesia Post Note  Patient: Benjamin Oliver  Procedure(s) Performed: COLONOSCOPY WITH PROPOFOL (N/A )  Patient location during evaluation: Endoscopy Anesthesia Type: General Level of consciousness: awake and alert Pain management: pain level controlled Vital Signs Assessment: post-procedure vital signs reviewed and stable Respiratory status: spontaneous breathing, nonlabored ventilation and respiratory function stable Cardiovascular status: blood pressure returned to baseline and stable Postop Assessment: no apparent nausea or vomiting Anesthetic complications: no   No complications documented.   Last Vitals:  Vitals:   04/22/20 0906 04/22/20 0916  BP: 111/82 (!) 125/91  Pulse: 66 68  Resp: 18 16  Temp:    SpO2: 99% 98%    Last Pain:  Vitals:   04/22/20 0916  TempSrc:   PainSc: 0-No pain                 Alphonsus Sias

## 2020-04-22 NOTE — Anesthesia Preprocedure Evaluation (Addendum)
Anesthesia Evaluation  Patient identified by MRN, date of birth, ID band Patient awake    Reviewed: Allergy & Precautions, H&P , NPO status , reviewed documented beta blocker date and time   Airway Mallampati: III  TM Distance: >3 FB Neck ROM: full    Dental  (+) Poor Dentition, Chipped, Caps   Pulmonary Current Smoker and Patient abstained from smoking.,    Pulmonary exam normal        Cardiovascular Normal cardiovascular exam     Neuro/Psych Current eval for MS, mostly lower body  Neuromuscular disease    GI/Hepatic   Endo/Other  Hypothyroidism   Renal/GU      Musculoskeletal   Abdominal   Peds  Hematology   Anesthesia Other Findings Past Medical History: No date: Hypothyroidism Work-up for MS, CSF w WBC and lesions on MRI. Pt states is 85% improved since Oct/Nov 2021  Past Surgical History: 1984: LEG SURGERY; Left No date: WRIST SURGERY; Right  BMI    Body Mass Index: 31.80 kg/m      Reproductive/Obstetrics                            Anesthesia Physical Anesthesia Plan  ASA: III  Anesthesia Plan: General   Post-op Pain Management:    Induction: Intravenous  PONV Risk Score and Plan: Treatment may vary due to age or medical condition and TIVA  Airway Management Planned: Nasal Cannula and Natural Airway  Additional Equipment:   Intra-op Plan:   Post-operative Plan:   Informed Consent: I have reviewed the patients History and Physical, chart, labs and discussed the procedure including the risks, benefits and alternatives for the proposed anesthesia with the patient or authorized representative who has indicated his/her understanding and acceptance.     Dental Advisory Given  Plan Discussed with: CRNA  Anesthesia Plan Comments:         Anesthesia Quick Evaluation

## 2020-04-22 NOTE — H&P (Signed)
Jonathon Bellows, MD [address] [place], [place], [place], [address] [address], [place], [place], [place], [address] Phone: [phone]  Fax: [phone]  Primary Care Physician:  Trinna Post, PA-C   Pre-Procedure History & Physical: HPI:  Benjamin Oliver is a 54 y.o. male is here for an colonoscopy.   Past Medical History:  Diagnosis Date  . Hypothyroidism     Past Surgical History:  Procedure Laterality Date  . LEG SURGERY Left 1984  . WRIST SURGERY Right     Prior to Admission medications   Medication Sig Start Date End Date Taking? Authorizing Provider  acetaminophen (TYLENOL) 500 MG tablet Take 1,000-1,500 mg by mouth every 8 (eight) hours as needed for moderate pain.   Yes [provider]  Ascorbic Acid (VITAMIN C) 1000 MG tablet Take 1,000 mg by mouth daily.   Yes [provider]  atorvastatin (LIPITOR) 10 MG tablet Take 1 tablet (10 mg total) by mouth daily. 04/13/20  Yes Carles Collet M, PA-C  calcium carbonate (TUMS) 500 MG chewable tablet Chew 1,000-1,500 mg by mouth daily as needed for indigestion or heartburn.   Yes [provider]  levothyroxine (SYNTHROID) 75 MCG tablet Take 1 tablet (75 mcg total) by mouth daily. 02/11/20  Yes Carles Collet M, PA-C  loratadine (CLARITIN) 10 MG tablet Take 10 mg by mouth daily as needed for allergies.    Yes [provider]  metFORMIN (GLUCOPHAGE XR) 500 MG 24 hr tablet Take 1 tablet (500 mg total) by mouth daily with breakfast. 04/13/20  Yes Carles Collet M, PA-C  Multiple Vitamin (MULTIVITAMIN WITH MINERALS) TABS tablet Take 1 tablet by mouth daily.   Yes [provider]  VITAMIN D PO Take 1 capsule by mouth daily.   Yes [provider]  cyclobenzaprine (FLEXERIL) 10 MG tablet Take 1 tablet (10 mg total) by mouth 3 (three) times daily as needed for muscle spasms. Patient not taking: Reported on 04/13/2020 02/03/20   Trinna Post, PA-C  diazepam  (VALIUM) 5 MG tablet Take one tablet an hour before procedure. Patient not taking: Reported on 04/22/2020 03/17/20   Trinna Post, PA-C  Dimethyl Fumarate 120 MG CPDR Take 120 mg by mouth 2 (two) times daily. 04/14/20   Sater, Nanine Means, MD  Dimethyl Fumarate 240 MG CPDR Take 1 capsule (240 mg total) by mouth 2 (two) times daily. 04/14/20   Sater, Nanine Means, MD  ibuprofen (ADVIL) 200 MG tablet Take 400-800 mg by mouth every 6 (six) hours as needed for moderate pain.    [provider]  meloxicam (MOBIC) 15 MG tablet Take 1 tablet (15 mg total) by mouth daily. 04/07/20   Criselda Peaches, DPM    Allergies as of 03/23/2020  . (No Known Allergies)    Family History  Problem Relation Age of Onset  . Cancer Brother        lung cancer    Social History   Socioeconomic History  . Marital status: Married    Spouse name: Not on file  . Number of children: Not on file  . Years of education: Not on file  . Highest education level: Not on file  Occupational History  . Not on file  Tobacco Use  . Smoking status: Current Every Day Smoker    Packs/day: 1.00    Types: Cigarettes  . Smokeless tobacco: Never Used  Vaping Use  . Vaping Use: Never used  Substance and Sexual  Activity  . Alcohol use: Yes    Comment: occassionally- very little  . Drug use: Never  . Sexual activity: Yes  Other Topics Concern  . Not on file  Social History Narrative   Right handed   Lives with wife.   Caffeine use: coffee daily (black)   Social Determinants of Health   Financial Resource Strain:   . Difficulty of Paying Living Expenses: Not on file  Food Insecurity:   . Worried About Charity fundraiser in the Last Year: Not on file  . Ran Out of Food in the Last Year: Not on file  Transportation Needs:   . Lack of Transportation (Medical): Not on file  . Lack of Transportation (Non-Medical): Not on file  Physical Activity:   . Days of Exercise per Week: Not on file  . Minutes of  Exercise per Session: Not on file  Stress:   . Feeling of Stress : Not on file  Social Connections:   . Frequency of Communication with Friends and Family: Not on file  . Frequency of Social Gatherings with Friends and Family: Not on file  . Attends Religious Services: Not on file  . Active Member of Clubs or Organizations: Not on file  . Attends Archivist Meetings: Not on file  . Marital Status: Not on file  Intimate Partner Violence:   . Fear of Current or Ex-Partner: Not on file  . Emotionally Abused: Not on file  . Physically Abused: Not on file  . Sexually Abused: Not on file    Review of Systems: See HPI, otherwise negative ROS  Physical Exam: BP (!) 110/95   Pulse 87   Temp (!) 97.2 F (36.2 C) (Temporal)   Resp 18   Ht 6' (1.829 m)   Wt 106.4 kg   SpO2 100%   BMI 31.80 kg/m  General:   Alert,  pleasant and cooperative in NAD Head:  Normocephalic and atraumatic. Neck:  Supple; no masses or thyromegaly. Lungs:  Clear throughout to auscultation, normal respiratory effort.    Heart:  +S1, +S2, Regular rate and rhythm, No edema. Abdomen:  Soft, nontender and nondistended. Normal bowel sounds, without guarding, and without rebound.   Neurologic:  Alert and  oriented x4;  grossly normal neurologically.  Impression/Plan: Benjamin Oliver is here for an colonoscopy to be performed for Screening colonoscopy brother had colon polyps Risks, benefits, limitations, and alternatives regarding  colonoscopy have been reviewed with the patient.  Questions have been answered.  All parties agreeable.   Jonathon Bellows, MD  04/22/2020, 8:23 AM

## 2020-04-22 NOTE — Op Note (Signed)
[facility] Gastroenterology Patient Name: Benjamin Oliver Procedure Date: 04/22/2020 7:51 AM MRN: [id number] Account #: [id number] Date of Birth: [birth year] Admit Type: Outpatient Age: 54 Room: [facility] ENDO ROOM 3 Gender: Male Note Status: Finalized Procedure:             Colonoscopy Indications:           Colon cancer screening in patient at increased risk:                         Family history of 1st-degree relative with colon polyps Providers:             Jonathon Bellows MD, MD Referring MD:          Wendee Beavers. Terrilee Croak (Referring MD) Medicines:             Monitored Anesthesia Care Complications:         No immediate complications. Procedure:             Pre-Anesthesia Assessment:                        - Prior to the procedure, a History and Physical was                         performed, and patient medications, allergies and                         sensitivities were reviewed. The patient's tolerance                         of previous anesthesia was reviewed.                        - The risks and benefits of the procedure and the                         sedation options and risks were discussed with the                         patient. All questions were answered and informed                         consent was obtained.                        - ASA Grade Assessment: II - A patient with mild                         systemic disease.                        After obtaining informed consent, the colonoscope was                         passed under direct vision. Throughout the procedure,                         the patient's blood pressure, pulse, and oxygen                         saturations  were monitored continuously. The                         Colonoscope was introduced through the anus and                         advanced to the the cecum, identified by the                         appendiceal orifice. The colonoscopy was performed                         with  ease. The patient tolerated the procedure well.                         The quality of the bowel preparation was good. Findings:      The perianal and digital rectal examinations were normal.      Non-bleeding internal hemorrhoids were found during retroflexion. The       hemorrhoids were large and Grade I (internal hemorrhoids that do not       prolapse).      A 8 mm polyp was found in the transverse colon. The polyp was sessile.       The polyp was removed with a cold snare. Resection and retrieval were       complete.      Two sessile polyps were found in the descending colon. The polyps were 7       to 10 mm in size. These polyps were removed with a cold snare. Resection       and retrieval were complete.      Three sessile polyps were found in the rectum. The polyps were 5 to 9 mm       in size. These polyps were removed with a cold snare. Resection and       retrieval were complete.      The exam was otherwise without abnormality on direct and retroflexion       views. Impression:            - Non-bleeding internal hemorrhoids.                        - One 8 mm polyp in the transverse colon, removed with                         a cold snare. Resected and retrieved.                        - Two 7 to 10 mm polyps in the descending colon,                         removed with a cold snare. Resected and retrieved.                        - Three 5 to 9 mm polyps in the rectum, removed with a                         cold snare. Resected and retrieved.                        -  The examination was otherwise normal on direct and                         retroflexion views. Recommendation:        - Discharge patient to home (with escort).                        - Resume previous diet.                        - Continue present medications.                        - Await pathology results.                        - Repeat colonoscopy in 3 years for surveillance. Procedure Code(s):     ---  Professional ---                        [phone], Colonoscopy, flexible; with removal of                         tumor(s), polyp(s), or other lesion(s) by snare                         technique Diagnosis Code(s):     --- Professional ---                        Z83.71, Family history of colonic polyps                        K63.5, Polyp of colon                        K62.1, Rectal polyp                        K64.0, First degree hemorrhoids CPT copyright 2019 American Medical Association. All rights reserved. The codes documented in this report are preliminary and upon coder review may  be revised to meet current compliance requirements. Jonathon Bellows, MD Jonathon Bellows MD, MD 04/22/2020 8:54:12 AM This report has been signed electronically. Number of Addenda: 0 Note Initiated On: 04/22/2020 7:51 AM Scope Withdrawal Time: 0 hours 20 minutes 52 seconds  Total Procedure Duration: 0 hours 24 minutes 1 second  Estimated Blood Loss:  Estimated blood loss: none.      [facility]

## 2020-04-22 NOTE — Transfer of Care (Signed)
Immediate Anesthesia Transfer of Care Note  Patient: Benjamin Oliver  Procedure(s) Performed: COLONOSCOPY WITH PROPOFOL (N/A )  Patient Location: Endoscopy Unit  Anesthesia Type:General  Level of Consciousness: drowsy and patient cooperative  Airway & Oxygen Therapy: Patient Spontanous Breathing and Patient connected to nasal cannula oxygen  Post-op Assessment: Report given to RN and Post -op Vital signs reviewed and stable  Post vital signs: Reviewed and stable  Last Vitals:  Vitals Value Taken Time  BP 107/69 04/22/20 0900  Temp 36.2 C 04/22/20 0856  Pulse 72 04/22/20 0900  Resp 22 04/22/20 0900  SpO2 96 % 04/22/20 0900  Vitals shown include unvalidated device data.  Last Pain:  Vitals:   04/22/20 0856  TempSrc: Temporal  PainSc: Asleep         Complications: No complications documented.

## 2020-04-23 ENCOUNTER — Encounter: Payer: Self-pay | Admitting: Gastroenterology

## 2020-04-23 LAB — SURGICAL PATHOLOGY

## 2020-04-26 ENCOUNTER — Encounter: Payer: Self-pay | Admitting: Gastroenterology

## 2020-04-26 NOTE — Telephone Encounter (Signed)
I called pt to see if he has received his dimethyl fumarate shipment. No answer, left a message asking him to call me back.

## 2020-04-27 NOTE — Telephone Encounter (Signed)
Pt returned my call. His shipment of dimethyl fumarate should arrive this week and he will begin taking it. I will check with him next week.

## 2020-05-05 NOTE — Telephone Encounter (Signed)
I called pt. No answer, left a message asking him to call me back. 

## 2020-05-17 ENCOUNTER — Other Ambulatory Visit: Payer: Self-pay | Admitting: Physician Assistant

## 2020-05-17 DIAGNOSIS — E1165 Type 2 diabetes mellitus with hyperglycemia: Secondary | ICD-10-CM

## 2020-05-17 MED ORDER — METFORMIN HCL ER 500 MG PO TB24: 500.0000 mg | ORAL_TABLET | Freq: Every day | ORAL | 0 refills | Status: DC

## 2020-05-17 NOTE — Telephone Encounter (Signed)
Requested medication (s) are due for refill today: yes  Requested medication (s) are on the active medication list: yes  Last refill:  04/14/20  Future visit scheduled: yes  Notes to clinic:  Please review for refill. Lab result notes from 04/14/20 states that patient will need to increase dosage of medication to take 2 pills in the morning and 2 pills at night by the 4th week. Patient will need a new prescription to be sent to the pharmacy    Requested Prescriptions  Pending Prescriptions Disp Refills   metFORMIN (GLUCOPHAGE XR) 500 MG 24 hr tablet 90 tablet 0    Sig: Take 1 tablet (500 mg total) by mouth daily with breakfast.      Endocrinology:  Diabetes - Biguanides Failed - 05/17/2020 12:32 PM      Failed - HBA1C is between 0 and 7.9 and within 180 days    Hgb A1c MFr Bld  Date Value Ref Range Status  04/13/2020 8.2 (H) 4.8 - 5.6 % Final    Comment:             Prediabetes: 5.7 - 6.4          Diabetes: >6.4          Glycemic control for adults with diabetes: <7.0           Passed - Cr in normal range and within 360 days    Creatinine  Date Value Ref Range Status  10/10/2012 0.85 0.60 - 1.30 mg/dL Final   Creatinine, Ser  Date Value Ref Range Status  02/10/2020 0.91 0.76 - 1.27 mg/dL Final          Passed - eGFR in normal range and within 360 days    EGFR (African American)  Date Value Ref Range Status  10/10/2012 >60  Final   GFR calc Af Amer  Date Value Ref Range Status  02/10/2020 109 >59 mL/min/1.73 Final    Comment:    **[facility] currently reports eGFR in compliance with the current**   recommendations of the [employer]. [facility] will   update reporting as new guidelines are published from the NKF-ASN   Task force.    EGFR (Non-African Amer.)  Date Value Ref Range Status  10/10/2012 >60  Final    Comment:    eGFR values <63m/min/1.73 m2 may be an indication of chronic kidney disease (CKD). Calculated eGFR is useful in patients  with stable renal function. The eGFR calculation will not be reliable in acutely ill patients when serum creatinine is changing rapidly. It is not useful in  patients on dialysis. The eGFR calculation may not be applicable to patients at the low and high extremes of body sizes, pregnant women, and vegetarians. POTASSIUM,AST - Slight hemolysis, interpret results with  - caution.    GFR calc non Af Amer  Date Value Ref Range Status  02/10/2020 95 >59 mL/min/1.73 Final          Passed - Valid encounter within last 6 months    Recent Outpatient Visits           1 month ago Type 2 diabetes mellitus with hyperglycemia, without long-term current use of insulin ([facility]   [place]  3 months ago Thoracic myelopathy   [place]  3 months ago Annual physical exam   [place]  1 year ago Right-sided Bell's palsy  [place], PA-C   2 years ago Bell's palsy   [employer], Vickki Muff, PA-C       Future Appointments             In 1 week Trinna Post, PA-C [employer], [place]

## 2020-05-17 NOTE — Telephone Encounter (Signed)
Medication Refill - Medication: metFORMIN (GLUCOPHAGE XR) 500 MG 24 hr tablet (Patient needs to prescription sent over to pharmacy since his dosage increase.)  Has the patient contacted their pharmacy? Yes (Agent: If no, request that the patient contact the pharmacy for the refill.) (Agent: If yes, when and what did the pharmacy advise?)Contact PCP  Preferred Pharmacy (with phone number or street name):  [facility] [phone] [place], [place] - [address] AT [address] [facility] Phone:  [phone]  Fax:  [phone]       Agent: Please be advised that RX refills may take up to 3 business days. We ask that you follow-up with your pharmacy.

## 2020-05-18 ENCOUNTER — Telehealth: Payer: Self-pay | Admitting: Physician Assistant

## 2020-05-18 DIAGNOSIS — E1165 Type 2 diabetes mellitus with hyperglycemia: Secondary | ICD-10-CM

## 2020-05-18 MED ORDER — METFORMIN HCL 500 MG PO TABS: ORAL_TABLET | ORAL | 1 refills | Status: DC

## 2020-05-18 NOTE — Telephone Encounter (Signed)
Refill sent in

## 2020-05-18 NOTE — Telephone Encounter (Signed)
Pt calling again stating that he is needing to have an update on the new prescription. Pt states that he will be out of this medication tomorrow. Please advise.

## 2020-05-18 NOTE — Telephone Encounter (Signed)
Patient was advised and states he will be in on January 6,2022.

## 2020-05-18 NOTE — Addendum Note (Signed)
Addended by: Trey Sailors on: 05/18/2020 04:26 PM   Modules accepted: Orders

## 2020-05-18 NOTE — Telephone Encounter (Signed)
Notes to clinic:  Marissa TO 2 IN AM AND 2 AT NIGHT. NEED NEW RX   Requested Prescriptions  Pending Prescriptions Disp Refills   metFORMIN (GLUCOPHAGE-XR) 500 MG 24 hr tablet [Pharmacy Med Name: METFORMIN HCL ER 500 MG TABLET] 90 tablet 0    Sig: TAKE 1 TABLET BY MOUTH EVERY DAY WITH BREAKFAST      Endocrinology:  Diabetes - Biguanides Failed - 05/18/2020  9:42 AM      Failed - HBA1C is between 0 and 7.9 and within 180 days    Hgb A1c MFr Bld  Date Value Ref Range Status  04/13/2020 8.2 (H) 4.8 - 5.6 % Final    Comment:             Prediabetes: 5.7 - 6.4          Diabetes: >6.4          Glycemic control for adults with diabetes: <7.0           Passed - Cr in normal range and within 360 days    Creatinine  Date Value Ref Range Status  10/10/2012 0.85 0.60 - 1.30 mg/dL Final   Creatinine, Ser  Date Value Ref Range Status  02/10/2020 0.91 0.76 - 1.27 mg/dL Final          Passed - eGFR in normal range and within 360 days    EGFR (African American)  Date Value Ref Range Status  10/10/2012 >60  Final   GFR calc Af Amer  Date Value Ref Range Status  02/10/2020 109 >59 mL/min/1.73 Final    Comment:    **[facility] currently reports eGFR in compliance with the current**   recommendations of the [employer]. [facility] will   update reporting as new guidelines are published from the NKF-ASN   Task force.    EGFR (Non-African Amer.)  Date Value Ref Range Status  10/10/2012 >60  Final    Comment:    eGFR values <49m/min/1.73 m2 may be an indication of chronic kidney disease (CKD). Calculated eGFR is useful in patients with stable renal function. The eGFR calculation will not be reliable in acutely ill patients when serum creatinine is changing rapidly. It is not useful in  patients on dialysis. The eGFR calculation may not be applicable to patients at the low and high extremes of body sizes, pregnant women,  and vegetarians. POTASSIUM,AST - Slight hemolysis, interpret results with  - caution.    GFR calc non Af Amer  Date Value Ref Range Status  02/10/2020 95 >59 mL/min/1.73 Final          Passed - Valid encounter within last 6 months    Recent Outpatient Visits           1 month ago Type 2 diabetes mellitus with hyperglycemia, without long-term current use of insulin ([facility]   [place]  3 months ago Thoracic myelopathy   [facility] [place]  3 months ago Annual physical exam   [facility] [place]  1 year ago Right-sided Bell's palsy   [place]ki Muff PA-C   2 years ago Bell's palsy   [facility] [place]       Future Appointments             In 1 week [place]  [facility]

## 2020-05-18 NOTE — Telephone Encounter (Signed)
Pts Metformin was suppose to increased to 4 daily / take two at breakfast and two with dinner / please advise due to wrong RX dose being sent

## 2020-05-24 NOTE — Telephone Encounter (Signed)
I called pt. He is doing well on dimethyl fumarate. He asked that his appt 08/04/20 be pushed out because of conflicting appts with his eye doctor. Pt feels stable. An appt was made for 09/22/20 at 2:30pm. Pt will call us if he decides he needs a sooner appt.

## 2020-05-25 ENCOUNTER — Ambulatory Visit (INDEPENDENT_AMBULATORY_CARE_PROVIDER_SITE_OTHER): Payer: Managed Care, Other (non HMO) | Admitting: Podiatry

## 2020-05-25 ENCOUNTER — Other Ambulatory Visit: Payer: Self-pay

## 2020-05-25 DIAGNOSIS — M722 Plantar fascial fibromatosis: Secondary | ICD-10-CM

## 2020-05-25 NOTE — Patient Instructions (Signed)

## 2020-05-26 ENCOUNTER — Encounter: Payer: Self-pay | Admitting: Physician Assistant

## 2020-05-26 ENCOUNTER — Other Ambulatory Visit: Payer: Self-pay

## 2020-05-26 ENCOUNTER — Ambulatory Visit: Payer: Managed Care, Other (non HMO) | Admitting: Physician Assistant

## 2020-05-26 VITALS — BP 127/76 | HR 84 | Temp 98.6°F | Wt 230.1 lb

## 2020-05-26 DIAGNOSIS — E1169 Type 2 diabetes mellitus with other specified complication: Secondary | ICD-10-CM | POA: Diagnosis not present

## 2020-05-26 DIAGNOSIS — E785 Hyperlipidemia, unspecified: Secondary | ICD-10-CM | POA: Diagnosis not present

## 2020-05-26 DIAGNOSIS — E119 Type 2 diabetes mellitus without complications: Secondary | ICD-10-CM | POA: Diagnosis not present

## 2020-05-26 LAB — POCT GLYCOSYLATED HEMOGLOBIN (HGB A1C)
Est. average glucose Bld gHb Est-mCnc: 146
Hemoglobin A1C: 6.7 % — AB (ref 4.0–5.6)

## 2020-05-26 MED ORDER — METFORMIN HCL 1000 MG PO TABS: 1000.0000 mg | ORAL_TABLET | Freq: Two times a day (BID) | ORAL | 1 refills | Status: DC

## 2020-05-26 NOTE — Progress Notes (Signed)
Established patient visit   Patient: Benjamin Oliver   DOB: [birth year]   55 y.o. Male  MRN: [id number] Visit Date: 05/26/2020  Today's healthcare provider: Trey Sailors, PA-C   Chief Complaint  Patient presents with  . Diabetes   Subjective    HPI  Diabetes Mellitus Type II, Follow-up  Lab Results  Component Value Date   HGBA1C 8.2 (H) 04/13/2020   HGBA1C 7.1 (H) 02/10/2020   Wt Readings from Last 3 Encounters:  05/26/20 230 lb 1.6 oz (104.4 kg)  04/22/20 234 lb 7.7 oz (106.4 kg)  04/13/20 235 lb 14.4 oz (107 kg)   Last seen for diabetes 6 weeks ago.  Management since then includes started on Metformin 500 mg daily and titrate up to max dose of 1000 mg BID. He reports excellent compliance with treatment. He is not having side effects.  Symptoms: No fatigue No foot ulcerations  No appetite changes No nausea  No paresthesia of the feet  No polydipsia  No polyuria No visual disturbances   No vomiting     Home blood sugar records: fasting range: not checked  Episodes of hypoglycemia? No    Current insulin regiment: none Most Recent Eye Exam: Not UTD Current exercise: none Current diet habits: not asked  Pertinent Labs: Lab Results  Component Value Date   CHOL 219 (H) 02/10/2020   HDL 42 02/10/2020   LDLCALC 159 (H) 02/10/2020   TRIG 101 02/10/2020   CHOLHDL 5.2 (H) 02/10/2020   Lab Results  Component Value Date   NA 139 02/10/2020   K 4.5 02/10/2020   CREATININE 0.91 02/10/2020   GFRNONAA 95 02/10/2020   GFRAA 109 02/10/2020   GLUCOSE 118 (H) 02/10/2020     ---------------------------------------------------------------------------------------------------  Lipid/Cholesterol, Follow-up  Last lipid panel Other pertinent labs  Lab Results  Component Value Date   CHOL 136 05/26/2020   HDL 49 05/26/2020   LDLCALC 76 05/26/2020   TRIG 49 05/26/2020   CHOLHDL 2.8 05/26/2020   Lab Results  Component Value Date   ALT 44 02/10/2020   AST 22  02/10/2020   PLT 261 02/10/2020   TSH 3.620 04/13/2020     He was last seen for this 2 months ago.  Management since that visit includes starting lipitor 10 mg QHS.  He reports excellent compliance with treatment. He is not having side effects.   Symptoms: No chest pain No chest pressure/discomfort  No dyspnea No lower extremity edema  No numbness or tingling of extremity No orthopnea  No palpitations No paroxysmal nocturnal dyspnea  No speech difficulty No syncope   Current diet: in general, a "healthy" diet   Current exercise: none  The 10-year ASCVD risk score Denman George DC Jr., et al., 2013) is: 12.6%  ---------------------------------------------------------------------------------------------------    Medications: Outpatient Medications Prior to Visit  Medication Sig  . acetaminophen (TYLENOL) 500 MG tablet Take 1,000-1,500 mg by mouth every 8 (eight) hours as needed for moderate pain.  . Ascorbic Acid (VITAMIN C) 1000 MG tablet Take 1,000 mg by mouth daily.  Marland Kitchen atorvastatin (LIPITOR) 10 MG tablet Take 1 tablet (10 mg total) by mouth daily.  . calcium carbonate (TUMS - DOSED IN MG ELEMENTAL CALCIUM) 500 MG chewable tablet Chew 1,000-1,500 mg by mouth daily as needed for indigestion or heartburn.  . Dimethyl Fumarate 120 MG CPDR Take 120 mg by mouth 2 (two) times daily.  . Dimethyl Fumarate 240 MG CPDR Take 1 capsule (240 mg total)  by mouth 2 (two) times daily.  Marland Kitchen ibuprofen (ADVIL) 200 MG tablet Take 400-800 mg by mouth every 6 (six) hours as needed for moderate pain.  Marland Kitchen levothyroxine (SYNTHROID) 75 MCG tablet Take 1 tablet (75 mcg total) by mouth daily.  . meloxicam (MOBIC) 15 MG tablet Take 1 tablet (15 mg total) by mouth daily.  . Multiple Vitamin (MULTIVITAMIN WITH MINERALS) TABS tablet Take 1 tablet by mouth daily.  Marland Kitchen VITAMIN D PO Take 1 capsule by mouth daily.  . [DISCONTINUED] metFORMIN (GLUCOPHAGE) 500 MG tablet Take 1000 mg daily and 500 mg in the night.  .  cyclobenzaprine (FLEXERIL) 10 MG tablet Take 1 tablet (10 mg total) by mouth 3 (three) times daily as needed for muscle spasms. (Patient not taking: Reported on 05/26/2020)  . diazepam (VALIUM) 5 MG tablet Take one tablet an hour before procedure. (Patient not taking: Reported on 05/26/2020)  . loratadine (CLARITIN) 10 MG tablet Take 10 mg by mouth daily as needed for allergies.  (Patient not taking: Reported on 05/26/2020)   No facility-administered medications prior to visit.    Review of Systems  Constitutional: Negative.   Respiratory: Negative.   Cardiovascular: Negative.   Hematological: Negative.       Objective    BP 127/76 (BP Location: Left Arm, Patient Position: Sitting, Cuff Size: Large)   Pulse 84   Temp 98.6 F (37 C) (Oral)   Wt 230 lb 1.6 oz (104.4 kg)   SpO2 98%   BMI 31.21 kg/m    Physical Exam Constitutional:      Appearance: Normal appearance.  Cardiovascular:     Rate and Rhythm: Normal rate and regular rhythm.     Pulses: Normal pulses.     Heart sounds: Normal heart sounds.  Pulmonary:     Effort: Pulmonary effort is normal.     Breath sounds: Normal breath sounds.  Skin:    General: Skin is warm and dry.  Neurological:     General: No focal deficit present.     Mental Status: He is alert and oriented to person, place, and time.  Psychiatric:        Mood and Affect: Mood normal.        Behavior: Behavior normal.       No results found for any visits on 05/26/20.  Assessment & Plan    1. Controlled type 2 diabetes mellitus without complication, without long-term current use of insulin ([place])  Much improved with metformin, continue. - POCT HgB A1C - metFORMIN (GLUCOPHAGE) 1000 MG tablet; Take 1 tablet (1,000 mg total) by mouth 2 (two) times daily with a meal.  Dispense: 180 tablet; Refill: 1  2. Hyperlipidemia associated with type 2 diabetes mellitus ([place])  Much improved with lipitor, continue.   - Lipid Profile   No follow-ups on file.       ITrinna Post, PA-C, have reviewed all documentation for this visit. The documentation on 06/02/20 for the exam, diagnosis, procedures, and orders are all accurate and complete.  The entirety of the information documented in the History of Present Illness, Review of Systems and Physical Exam were personally obtained by me. Portions of this information were initially documented by [facility] and reviewed by me for thoroughness and accuracy.     Paulene Floor  [facility] [phone] (phone) [phone] (fax)  [place]

## 2020-05-27 LAB — LIPID PANEL
Chol/HDL Ratio: 2.8 ratio (ref 0.0–5.0)
Cholesterol, Total: 136 mg/dL (ref 100–199)
HDL: 49 mg/dL (ref >39)
LDL Chol Calc (NIH): 76 mg/dL (ref 0–99)
Triglycerides: 49 mg/dL (ref 0–149)
VLDL Cholesterol Cal: 11 mg/dL (ref 5–40)

## 2020-06-02 DIAGNOSIS — E1169 Type 2 diabetes mellitus with other specified complication: Secondary | ICD-10-CM | POA: Insufficient documentation

## 2020-06-02 DIAGNOSIS — E785 Hyperlipidemia, unspecified: Secondary | ICD-10-CM | POA: Insufficient documentation

## 2020-06-13 ENCOUNTER — Other Ambulatory Visit: Payer: Self-pay | Admitting: Podiatry

## 2020-06-14 ENCOUNTER — Other Ambulatory Visit: Payer: Self-pay

## 2020-06-14 ENCOUNTER — Encounter: Payer: Self-pay | Admitting: Gastroenterology

## 2020-06-14 ENCOUNTER — Ambulatory Visit (INDEPENDENT_AMBULATORY_CARE_PROVIDER_SITE_OTHER): Payer: Managed Care, Other (non HMO) | Admitting: Gastroenterology

## 2020-06-14 VITALS — BP 118/75 | HR 80 | Temp 98.3°F | Ht 72.0 in | Wt 222.0 lb

## 2020-06-14 DIAGNOSIS — K648 Other hemorrhoids: Secondary | ICD-10-CM

## 2020-06-14 MED ORDER — BETAMETHASONE SOD PHOS & ACET 6 (3-3) MG/ML IJ SUSP: 3.0000 mg | Freq: Once | INTRAMUSCULAR | Status: AC

## 2020-06-14 NOTE — Patient Instructions (Addendum)
High-Fiber Eating Plan Fiber, also called dietary fiber, is a type of carbohydrate. It is found foods such as fruits, vegetables, whole grains, and beans. A high-fiber diet can have many health benefits. Your health care provider may recommend a high-fiber diet to help:  Prevent constipation. Fiber can make your bowel movements more regular.  Lower your cholesterol.  Relieve the following conditions: ? Inflammation of veins in the anus (hemorrhoids). ? Inflammation of specific areas of the digestive tract (uncomplicated diverticulosis). ? A problem of the large intestine, also called the colon, that sometimes causes pain and diarrhea (irritable bowel syndrome, or IBS).  Prevent overeating as part of a weight-loss plan.  Prevent heart disease, type 2 diabetes, and certain cancers. What are tips for following this plan? Reading food labels  Check the nutrition facts label on food products for the amount of dietary fiber. Choose foods that have 5 grams of fiber or more per serving.  The goals for recommended daily fiber intake include: ? Men (age 50 or younger): 34-38 g. ? Men (over age 50): 28-34 g. ? Women (age 50 or younger): 25-28 g. ? Women (over age 50): 22-25 g. Your daily fiber goal is _____________ g.   Shopping  Choose whole fruits and vegetables instead of processed forms, such as apple juice or applesauce.  Choose a wide variety of high-fiber foods such as avocados, lentils, oats, and kidney beans.  Read the nutrition facts label of the foods you choose. Be aware of foods with added fiber. These foods often have high sugar and sodium amounts per serving. Cooking  Use whole-grain flour for baking and cooking.  Cook with brown rice instead of white rice. Meal planning  Start the day with a breakfast that is high in fiber, such as a cereal that contains 5 g of fiber or more per serving.  Eat breads and cereals that are made with whole-grain flour instead of refined  flour or white flour.  Eat brown rice, bulgur wheat, or millet instead of white rice.  Use beans in place of meat in soups, salads, and pasta dishes.  Be sure that half of the grains you eat each day are whole grains. General information  You can get the recommended daily intake of dietary fiber by: ? Eating a variety of fruits, vegetables, grains, nuts, and beans. ? Taking a fiber supplement if you are not able to take in enough fiber in your diet. It is better to get fiber through food than from a supplement.  Gradually increase how much fiber you consume. If you increase your intake of dietary fiber too quickly, you may have bloating, cramping, or gas.  Drink plenty of water to help you digest fiber.  Choose high-fiber snacks, such as berries, raw vegetables, nuts, and popcorn. What foods should I eat? Fruits Berries. Pears. Apples. Oranges. Avocado. Prunes and raisins. Dried figs. Vegetables Sweet potatoes. Spinach. Kale. Artichokes. Cabbage. Broccoli. Cauliflower. Green peas. Carrots. Squash. Grains Whole-grain breads. Multigrain cereal. Oats and oatmeal. Brown rice. Barley. Bulgur wheat. Millet. Quinoa. Bran muffins. Popcorn. Rye wafer crackers. Meats and other proteins Navy beans, kidney beans, and pinto beans. Soybeans. Split peas. Lentils. Nuts and seeds. Dairy Fiber-fortified yogurt. Beverages Fiber-fortified soy milk. Fiber-fortified orange juice. Other foods Fiber bars. The items listed above may not be a complete list of recommended foods and beverages. Contact a dietitian for more information. What foods should I avoid? Fruits Fruit juice. Cooked, strained fruit. Vegetables Fried potatoes. Canned vegetables. Well-cooked vegetables. Grains   White bread. Pasta made with refined flour. White rice. Meats and other proteins Fatty cuts of meat. Fried chicken or fried fish. Dairy Milk. Yogurt. Cream cheese. Sour cream. Fats and oils Butters. Beverages Soft  drinks. Other foods Cakes and pastries. The items listed above may not be a complete list of foods and beverages to avoid. Talk with your dietitian about what choices are best for you. Summary  Fiber is a type of carbohydrate. It is found in foods such as fruits, vegetables, whole grains, and beans.  A high-fiber diet has many benefits. It can help to prevent constipation, lower blood cholesterol, aid weight loss, and reduce your risk of heart disease, diabetes, and certain cancers.  Increase your intake of fiber gradually. Increasing fiber too quickly may cause cramping, bloating, and gas. Drink plenty of water while you increase the amount of fiber you consume.  The best sources of fiber include whole fruits and vegetables, whole grains, nuts, seeds, and beans. This information is not intended to replace advice given to you by your health care provider. Make sure you discuss any questions you have with your health care provider. Document Revised: 09/11/2019 Document Reviewed: 09/11/2019 Elsevier Patient Education  2021 [place].    Fiber pill samples were given in office.

## 2020-06-14 NOTE — Progress Notes (Signed)
   Subjective: 55 y.o. male presenting today for follow-up evaluation regarding bilateral plantar fascitis.  Patient was last seen in the office on 04/07/2020 under the care of Dr. Sherryle Lis.  Patient states that he wears his orthotics daily and his work boots.  There is some overall minimal improvement.  He continues to have soreness and tenderness to the feet with a gradual onset throughout the day.  He has been taking the meloxicam as instructed.  No new complaints at this time  Past Medical History:  Diagnosis Date  . Hypothyroidism      Objective: Physical Exam General: The patient is alert and oriented x3 in no acute distress.  Dermatology: Skin is warm, dry and supple bilateral lower extremities. Negative for open lesions or macerations bilateral.   Vascular: Dorsalis Pedis and Posterior Tibial pulses palpable bilateral.  Capillary fill time is immediate to all digits.  Neurological: Epicritic and protective threshold intact bilateral.   Musculoskeletal: Tenderness to palpation to the plantar aspect of the bilateral heels along the plantar fascia. All other joints range of motion within normal limits bilateral. Strength 5/5 in all groups bilateral.   Assessment: 1. plantar fasciitis bilateral feet  Plan of Care:  1. Patient evaluated.    2. Injection of 0.5cc Celestone soluspan injected into the bilateral heels.  3.  Continue meloxicam daily as needed 4.  Continue custom molded orthotics daily 5.  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 Edrick Kins, DPM    [address] [place], [place] [address]                Office [phone]  Fax [phone]

## 2020-06-14 NOTE — Telephone Encounter (Signed)
Please advise 

## 2020-06-14 NOTE — Progress Notes (Signed)
Jonathon Bellows MD, MRCP(U.K) [address]  [place]  [place], [place] [address]  Main: [phone]  Fax: [phone]   Gastroenterology Consultation  Referring Provider:     Paulene Floor Primary Care Physician:  Paulene Floor Primary Gastroenterologist:  Dr. Jonathon Bellows  Reason for Consultation:     Discuss about hemorrhoids        HPI:   Benjamin Oliver is a 55 y.o. y/o male referred for consultation & management  by  Trinna Post, PA-C.    He underwent a colonoscopy on 04/22/2020 for colon cancer screening and was found to have large internal hemorrhoids he had 6 sessile polyps which were resected.  There were tubular adenomas.  He is here to discuss about his internal hemorrhoids. He is here today to discuss about his internal hemorrhoids.  Previously developed causing occasional bleeding.  Since he changed his diet they have not caused him any problems.  Only occasional discomfort is a sense of swelling in his perianal area.  Denies any constipation.  Past Medical History:  Diagnosis Date  . Hypothyroidism     Past Surgical History:  Procedure Laterality Date  . COLONOSCOPY WITH PROPOFOL N/A 04/22/2020   Procedure: COLONOSCOPY WITH PROPOFOL;  Surgeon: Jonathon Bellows, MD;  Location: [facility] ENDOSCOPY;  Service: Gastroenterology;  Laterality: N/A;  . LEG SURGERY Left 1984  . WRIST SURGERY Right     Prior to Admission medications   Medication Sig Start Date End Date Taking? Authorizing Provider  acetaminophen (TYLENOL) 500 MG tablet Take 1,000-1,500 mg by mouth every 8 (eight) hours as needed for moderate pain.    [provider]  Ascorbic Acid (VITAMIN C) 1000 MG tablet Take 1,000 mg by mouth daily.    [provider]  atorvastatin (LIPITOR) 10 MG tablet Take 1 tablet (10 mg total) by mouth daily. 04/13/20   Trinna Post, PA-C  calcium carbonate (TUMS - DOSED IN MG ELEMENTAL CALCIUM) 500 MG chewable tablet Chew 1,000-1,500 mg by  mouth daily as needed for indigestion or heartburn.    [provider]  cyclobenzaprine (FLEXERIL) 10 MG tablet Take 1 tablet (10 mg total) by mouth 3 (three) times daily as needed for muscle spasms. Patient not taking: Reported on 05/26/2020 02/03/20   Trinna Post, PA-C  diazepam (VALIUM) 5 MG tablet Take one tablet an hour before procedure. Patient not taking: Reported on 05/26/2020 03/17/20   Trinna Post, PA-C  Dimethyl Fumarate 120 MG CPDR Take 120 mg by mouth 2 (two) times daily. 04/14/20   Sater, Nanine Means, MD  Dimethyl Fumarate 240 MG CPDR Take 1 capsule (240 mg total) by mouth 2 (two) times daily. 04/14/20   Sater, Nanine Means, MD  ibuprofen (ADVIL) 200 MG tablet Take 400-800 mg by mouth every 6 (six) hours as needed for moderate pain.    [provider]  levothyroxine (SYNTHROID) 75 MCG tablet Take 1 tablet (75 mcg total) by mouth daily. 02/11/20   Trinna Post, PA-C  loratadine (CLARITIN) 10 MG tablet Take 10 mg by mouth daily as needed for allergies.  Patient not taking: Reported on 05/26/2020    [provider]  meloxicam (MOBIC) 15 MG tablet Take 1 tablet (15 mg total) by mouth daily. 04/07/20   Criselda Peaches, DPM  metFORMIN (GLUCOPHAGE) 1000 MG tablet Take 1 tablet (1,000 mg total) by mouth 2 (two) times daily with a meal. 05/26/20 11/22/20  Trinna Post, PA-C  Multiple Vitamin (MULTIVITAMIN WITH MINERALS) TABS tablet Take 1 tablet by mouth daily.    [provider]  VITAMIN D PO Take 1 capsule by mouth daily.    [provider]    Family History  Problem Relation Age of Onset  . Cancer Brother        lung cancer     Social History   Tobacco Use  . Smoking status: Current Every Day Smoker    Packs/day: 1.00    Types: Cigarettes  . Smokeless tobacco: Never Used  Vaping Use  . Vaping Use: Never used  Substance Use Topics  . Alcohol use: Yes    Comment: occassionally- very little  . Drug use: Never    Allergies  as of 06/14/2020  . (No Known Allergies)    Review of Systems:    All systems reviewed and negative except where noted in HPI.   Physical Exam:  BP 118/75   Pulse 80   Temp 98.3 F (36.8 C) (Oral)   Ht 6' (1.829 m)   Wt 222 lb (100.7 kg)   BMI 30.11 kg/m  No LMP for male patient. Psych:  Alert and cooperative. Normal mood and affect. General:   Alert,  Well-developed, well-nourished, pleasant and cooperative in NAD Psych:  Alert and cooperative. Normal mood and affect.  Imaging Studies: No results found.  Assessment and Plan:   Benjamin Oliver is a 55 y.o. y/o male has been referred for symptomatic internal hemorrhoids which were large in size noted on recent colonoscopy.  His main symptoms at perianal discomfort.  No pruritus or rectal bleeding.  Discussed in detail about lifestyle changes including a high-fiber diet with a target of 25 to 30 g/day.  Discussed about fiber supplements which I would give him samples of which.  Explained the fiber supplements and not a replacement for dietary fiber but rather a complement to his daily food intake to get him to a goal of 25 g/day.  In addition discussed about perianal hygiene.  Obviously if this does not work in 4 to 6 weeks time.  We can discuss about banding  Follow up in 6 weeks telephone visit  Dr Jonathon Bellows MD,MRCP(U.K)

## 2020-08-02 ENCOUNTER — Telehealth: Payer: Managed Care, Other (non HMO) | Admitting: Gastroenterology

## 2020-08-04 ENCOUNTER — Ambulatory Visit: Payer: Managed Care, Other (non HMO) | Admitting: Neurology

## 2020-08-04 LAB — HM DIABETES EYE EXAM

## 2020-08-24 ENCOUNTER — Ambulatory Visit: Payer: Self-pay | Admitting: Physician Assistant

## 2020-08-24 ENCOUNTER — Ambulatory Visit: Payer: Self-pay | Admitting: Family Medicine

## 2020-09-02 ENCOUNTER — Ambulatory Visit: Payer: Self-pay | Admitting: Family Medicine

## 2020-09-16 ENCOUNTER — Other Ambulatory Visit: Payer: Self-pay

## 2020-09-16 ENCOUNTER — Encounter: Payer: Self-pay | Admitting: Family Medicine

## 2020-09-16 ENCOUNTER — Ambulatory Visit: Payer: Managed Care, Other (non HMO) | Admitting: Family Medicine

## 2020-09-16 VITALS — BP 113/73 | HR 58 | Temp 97.9°F | Resp 16 | Wt 205.2 lb

## 2020-09-16 DIAGNOSIS — H6982 Other specified disorders of Eustachian tube, left ear: Secondary | ICD-10-CM

## 2020-09-16 DIAGNOSIS — E663 Overweight: Secondary | ICD-10-CM | POA: Diagnosis not present

## 2020-09-16 DIAGNOSIS — J301 Allergic rhinitis due to pollen: Secondary | ICD-10-CM | POA: Diagnosis not present

## 2020-09-16 DIAGNOSIS — E039 Hypothyroidism, unspecified: Secondary | ICD-10-CM | POA: Diagnosis not present

## 2020-09-16 DIAGNOSIS — E785 Hyperlipidemia, unspecified: Secondary | ICD-10-CM

## 2020-09-16 DIAGNOSIS — E1169 Type 2 diabetes mellitus with other specified complication: Secondary | ICD-10-CM | POA: Diagnosis not present

## 2020-09-16 DIAGNOSIS — M722 Plantar fascial fibromatosis: Secondary | ICD-10-CM | POA: Insufficient documentation

## 2020-09-16 LAB — POCT GLYCOSYLATED HEMOGLOBIN (HGB A1C)
Est. average glucose Bld gHb Est-mCnc: 131
Hemoglobin A1C: 6.2 % — AB (ref 4.0–5.6)

## 2020-09-16 MED ORDER — CETIRIZINE HCL 10 MG PO TABS: 10.0000 mg | ORAL_TABLET | Freq: Every day | ORAL | 11 refills | Status: AC

## 2020-09-16 MED ORDER — DICLOFENAC SODIUM 1 % EX GEL: 4.0000 g | Freq: Four times a day (QID) | CUTANEOUS | 3 refills | Status: AC

## 2020-09-16 MED ORDER — FLUTICASONE PROPIONATE 50 MCG/ACT NA SUSP: 2.0000 | Freq: Every day | NASAL | 6 refills | Status: AC
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
## 2020-09-16 NOTE — Assessment & Plan Note (Signed)
Previously well controlled Likely to have improved with diet changes Continue atorvastatin at current dose Recheck FLP and CMP Goal LDL less than 70

## 2020-09-16 NOTE — Assessment & Plan Note (Signed)
Congratulated on weight loss ?Discussed importance of healthy weight management ?Discussed diet and exercise  ?

## 2020-09-16 NOTE — Assessment & Plan Note (Signed)
Well-controlled Associated with HLD and hypothyroidism Continue metformin at current dose Continue diet and exercise A1c is at goal Up-to-date on foot exam, eye exam, urine microalbumin On statin Follow-up in 6 months Consider Pneumovax again at that visit

## 2020-09-16 NOTE — Assessment & Plan Note (Signed)
Discussed chronic nature of Planter fasciitis Encouraged to follow with podiatry for intermittent injections if these are helpful even periodically Encourage rolling foot over water bottle that is ice cold at the end of the day

## 2020-09-16 NOTE — Assessment & Plan Note (Signed)
Uncontrolled Discontinue Claritin and start Zyrtec and Flonase Likely contributes to eustachian tube dysfunction as below

## 2020-09-16 NOTE — Assessment & Plan Note (Signed)
Previously well controlled Continue Synthroid at current dose  Recheck TSH and adjust Synthroid as indicated   

## 2020-09-17 LAB — COMPREHENSIVE METABOLIC PANEL
ALT: 82 IU/L — ABNORMAL HIGH (ref 0–44)
AST: 29 IU/L (ref 0–40)
Albumin/Globulin Ratio: 2.2 (ref 1.2–2.2)
Albumin: 4.6 g/dL (ref 3.8–4.9)
Alkaline Phosphatase: 74 IU/L (ref 44–121)
BUN/Creatinine Ratio: 20 (ref 9–20)
BUN: 15 mg/dL (ref 6–24)
Bilirubin Total: 0.3 mg/dL (ref 0.0–1.2)
CO2: 23 mmol/L (ref 20–29)
Calcium: 9.2 mg/dL (ref 8.7–10.2)
Chloride: 103 mmol/L (ref 96–106)
Creatinine, Ser: 0.76 mg/dL (ref 0.76–1.27)
Globulin, Total: 2.1 g/dL (ref 1.5–4.5)
Glucose: 95 mg/dL (ref 65–99)
Potassium: 4.6 mmol/L (ref 3.5–5.2)
Sodium: 141 mmol/L (ref 134–144)
Total Protein: 6.7 g/dL (ref 6.0–8.5)
eGFR: 105 mL/min/{1.73_m2} (ref >59)

## 2020-09-17 LAB — TSH: TSH: 5.74 u[IU]/mL — ABNORMAL HIGH (ref 0.450–4.500)

## 2020-09-17 LAB — LIPID PANEL
Chol/HDL Ratio: 2.5 ratio (ref 0.0–5.0)
Cholesterol, Total: 129 mg/dL (ref 100–199)
HDL: 51 mg/dL (ref >39)
LDL Chol Calc (NIH): 67 mg/dL (ref 0–99)
Triglycerides: 45 mg/dL (ref 0–149)
VLDL Cholesterol Cal: 11 mg/dL (ref 5–40)

## 2020-09-22 ENCOUNTER — Ambulatory Visit: Payer: Managed Care, Other (non HMO) | Admitting: Neurology

## 2020-09-22 ENCOUNTER — Encounter: Payer: Self-pay | Admitting: Neurology

## 2020-09-22 ENCOUNTER — Other Ambulatory Visit: Payer: Self-pay

## 2020-09-22 VITALS — BP 106/62 | HR 62 | Ht 72.0 in | Wt 201.0 lb

## 2020-09-22 DIAGNOSIS — R200 Anesthesia of skin: Secondary | ICD-10-CM | POA: Diagnosis not present

## 2020-09-22 DIAGNOSIS — Z79899 Other long term (current) drug therapy: Secondary | ICD-10-CM

## 2020-09-22 DIAGNOSIS — G35 Multiple sclerosis: Secondary | ICD-10-CM | POA: Diagnosis not present

## 2020-09-22 DIAGNOSIS — R748 Abnormal levels of other serum enzymes: Secondary | ICD-10-CM

## 2020-09-22 NOTE — Progress Notes (Signed)
[facility]  PATIENT: Benjamin Oliver DOB: [birth year]  REFERRING DOCTOR OR PCP:  Carles Collet SOURCE: Patient, notes from neurosurgery, imaging and laboratory reports, MRI images personally reviewed.  _________________________________   HISTORICAL  CHIEF COMPLAINT:  Chief Complaint  Patient presents with  . Follow-up    RM 12, alone. Last seen 04/05/2020. On dimethyl fumarate for MS. Recently rx'd prednisone. Wants to discuss this first before he takes it. Was given this by ENT for hearing loss in L ear for the last 6 wks. Unsure of cause. Thought it might be viral.    HISTORY OF PRESENT ILLNESS:  I had the pleasure of seeing your patient, Benjamin Oliver, at [facility] for neurologic consultation regarding his abnormal MRIs and concerned about multiple sclerosis.  Update 09/22/2020: He had the obset of reduced hearing loss on the left and saw ENT.  He was placed on a steroid pack and is asking if this is ok as also on DMF for the MS.  He denies any other new neurologic symptoms.    He is on DMF and never had any GI issues or flushing.    He feels the symptoms are better   The chest/abdomen numbness is almost resolved though he notes it still.    He continues to have foot pain, felt to be due to plantar fasciitis.    Balance is doing well.   Gait is fine. No weakness and only mild  Numbness.  He is on ladders and no difficulty with stairs.   He is an Clinical biochemist.  Bladder function is ok with mild frequency.   Vision is fine.  He recently saw ophtho and vision is fine.  He denies fatigue, mood issues or sleep issues.   Cognition is fine.   He has some stress caring for his brither with FTD (by description).       He has better glycemic control on metformin and feels better in general.   His HgbA1c was 6.2 (was 8+).      MS History: He began to experience foot pain and heel pain over the lasyt few months    He saw podiatry and injections  (plantar fasciiitis) helped.   He felt a funny bone paresthetic sensation in the right leg.   He then had a tight sensation in his groin bilaterally (like a wedgie).   His left leg then began to get numb a few days later and he saw his PCP  He had a lumbar MRI.  It showed mild spinal stenosis and lateral recess stenosis to the left at L3-L4.  There could be some effect on the left L4 nerve root.  At L4-L5 there was disc bulging towards the left but no nerve root compression.  Numbness worsened and progressed up to the bilateral axilla.    He saw Dr. Annette Stable of Neurosurgery a couple weeks later.    Due to the worsening symptoms, he ordered an MRI of the thoracic spine which was abnormal showing a lesion at T1-T2 (and also I see a focus at T8 to the right) .     He had a contrasted MRI of the thoracic spine a couple days later.  It showed enhancement of the larger lesion at T1-T2 as well as 2 small enhancing lesions at T5 and T12.  Because of the concern of multiple sclerosis, an MRI of the brain was performed 03/17/2020.  It showed nonspecific foci in the cerebral hemispheres.  There were no infratentorial  lesions.  Additionally a lumbar puncture was performed 03/24/2020.  Greater than 10 oligoclonal bands were present.  There were about 46 WBC/mm3 (93% lymphocytes) present as well.  He is referred for further evaluation of possible multiple sclerosis.   In November 2019, he had right sided facial weakness and was told he had Bell's palsy.  He could close his eye but could not wink and his smile was reduced.  Recovery took 3 months but he still has mild residual weakness.  In 2001, he had left sided Bell's palsy and couldn't close his left eye.   He recovered after a few weeks.    He started [facility] 03/2020.     Imaging review (images personally reviewed):  MRI of the thoracic spine without contrast February 29, 2020 showed a large central lesion at T1-T2 and a small right lateral lesion at T8.    MRI of the thoracic  spine with contrast 03/04/2020 showed enhancement of the lesion at T1-T2 more to the left.  There are other small enhancing lesions noted on the sagittal images (but not axial) at T5 and to the left at T11-T12 previous. The focus at T8 did not enhance.     MRI of the brain 03/17/2020 showed scattered T2/flair hyperintense foci.  Most of these were nonspecific in the deep white matter though a couple were in the periventricular white matter.  None of the foci enhanced.  There were no foci in the infratentorial white matter.  Overall, the appearance of these foci were more nonspecific.  MRI of the lumbar spine 02/04/2020 showed mild spinal stenosis and lateral recess stenosis to the left at L3-L4.  There could be some effect on the left L4 nerve root.  At L4-L5 there was disc bulging towards the left but no nerve root compression.   Laboratory evaluations: He had a lumbar puncture 03/24/2020.  There were greater than 10 oligoclonal bands.  He also had 46 white blood cells per cubic millimeter (93% lymphocytes)  TSH 02/10/2020 was 15.4 consistent with hypothyroidism.    Cholesterol was mildly elevated at 219 with an LDL of 159.  The cholesterol/HDL ratio was 5.2.  Hemoglobin A1c was elevated at 7.  HIV negative.  Vascular Risk Factors:   Mild DM, smoking (1 ppd x 40 years)  REVIEW OF SYSTEMS: Constitutional: No fevers, chills, sweats, or change in appetite Eyes: No visual changes, double vision, eye pain Ear, nose and throat: No hearing loss, ear pain, nasal congestion, sore throat Cardiovascular: No chest pain, palpitations Respiratory: No shortness of breath at rest or with exertion.   No wheezes GastrointestinaI: No nausea, vomiting, diarrhea, abdominal pain, fecal incontinence Genitourinary: No dysuria, urinary retention or frequency.  No nocturia. Musculoskeletal: No neck pain, back pain Integumentary: No rash, pruritus, skin lesions Neurological: as above Psychiatric: No depression at this  time.  No anxiety Endocrine: No palpitations, diaphoresis, change in appetite, change in weigh or increased thirst Hematologic/Lymphatic: No anemia, purpura, petechiae. Allergic/Immunologic: No itchy/runny eyes, nasal congestion, recent allergic reactions, rashes  ALLERGIES: No Known Allergies  HOME MEDICATIONS:  Current Outpatient Medications:  .  acetaminophen (TYLENOL) 500 MG tablet, Take 1,000-1,500 mg by mouth every 8 (eight) hours as needed for moderate pain., Disp: , Rfl:  .  Ascorbic Acid (VITAMIN C) 1000 MG tablet, Take 1,000 mg by mouth daily., Disp: , Rfl:  .  atorvastatin (LIPITOR) 10 MG tablet, Take 1 tablet (10 mg total) by mouth daily., Disp: 90 tablet, Rfl: 0 .  calcium carbonate (  TUMS - DOSED IN MG ELEMENTAL CALCIUM) 500 MG chewable tablet, Chew 1,000-1,500 mg by mouth daily as needed for indigestion or heartburn., Disp: , Rfl:  .  cetirizine (ZYRTEC) 10 MG tablet, Take 1 tablet (10 mg total) by mouth daily., Disp: 30 tablet, Rfl: 11 .  diclofenac Sodium (VOLTAREN) 1 % GEL, Apply 4 g topically 4 (four) times daily., Disp: 150 g, Rfl: 3 .  Dimethyl Fumarate 120 MG CPDR, Take 120 mg by mouth 2 (two) times daily., Disp: 14 capsule, Rfl: 0 .  Dimethyl Fumarate 240 MG CPDR, Take 1 capsule (240 mg total) by mouth 2 (two) times daily., Disp: 60 capsule, Rfl: 5 .  fluticasone (FLONASE) 50 MCG/ACT nasal spray, Place 2 sprays into both nostrils daily., Disp: 16 g, Rfl: 6 .  ibuprofen (ADVIL) 200 MG tablet, Take 400-800 mg by mouth every 6 (six) hours as needed for moderate pain., Disp: , Rfl:  .  levothyroxine (SYNTHROID) 75 MCG tablet, Take 1 tablet (75 mcg total) by mouth daily., Disp: 30 tablet, Rfl: 12 .  loratadine (CLARITIN) 10 MG tablet, Take 10 mg by mouth daily as needed for allergies., Disp: , Rfl:  .  meloxicam (MOBIC) 15 MG tablet, Take 1 tablet (15 mg total) by mouth daily., Disp: 30 tablet, Rfl: 1 .  metFORMIN (GLUCOPHAGE) 1000 MG tablet, Take 1 tablet (1,000 mg total)  by mouth 2 (two) times daily with a meal., Disp: 180 tablet, Rfl: 1 .  Multiple Vitamin (MULTIVITAMIN WITH MINERALS) TABS tablet, Take 1 tablet by mouth daily., Disp: , Rfl:  .  VITAMIN D PO, Take 1 capsule by mouth daily., Disp: , Rfl:  .  predniSONE (STERAPRED UNI-PAK 48 TAB) 10 MG (48) TBPK tablet, Take 2 tablets by mouth 2 (two) times daily. (Patient not taking: Reported on 09/22/2020), Disp: , Rfl:   Current Facility-Administered Medications:  .  betamethasone acetate-betamethasone sodium phosphate (CELESTONE) injection 3 mg, 3 mg, Intramuscular, Once, Amalia Hailey Dorathy Daft, DPM  PAST MEDICAL HISTORY: Past Medical History:  Diagnosis Date  . Hypothyroidism     PAST SURGICAL HISTORY: Past Surgical History:  Procedure Laterality Date  . COLONOSCOPY WITH PROPOFOL N/A 04/22/2020   Procedure: COLONOSCOPY WITH PROPOFOL;  Surgeon: Jonathon Bellows, MD;  Location[facility] ENDOSCOPY;  Service: Gastroenterology;  Laterality: N/A;  . LEG SURGERY Left 1984  . WRIST SURGERY Right     FAMILY HISTORY: Family History  Problem Relation Age of Onset  . Cancer Brother        lung cancer    SOCIAL HISTORY:  Social History   Socioeconomic History  . Marital status: Married    Spouse name: Not on file  . Number of children: Not on file  . Years of education: Not on file  . Highest education level: Not on file  Occupational History  . Not on file  Tobacco Use  . Smoking status: Current Every Day Smoker    Packs/day: 1.00    Types: Cigarettes  . Smokeless tobacco: Never Used  Vaping Use  . Vaping Use: Never used  Substance and Sexual Activity  . Alcohol use: Yes    Comment: occassionally- very little  . Drug use: Never  . Sexual activity: Yes  Other Topics Concern  . Not on file  Social History Narrative   Right handed   Lives with wife.   Caffeine use: coffee daily (black)   Social Determinants of Health   Financial Resource Strain: Not on file  Food Insecurity: Not on file  Transportation Needs: Not on file  Physical Activity: Not on file  Stress: Not on file  Social Connections: Not on file  Intimate Partner Violence: Not on file     PHYSICAL EXAM  Vitals:   09/22/20 1400  BP: 106/62  Pulse: 62  Weight: 201 lb (91.2 kg)  Height: 6' (1.829 m)    Body mass index is 27.26 kg/m.   General: The patient is well-developed and well-nourished and in no acute distress  HEENT:  Head is [place]/AT.  Sclera are anicteric.    Skin: Extremities are without rash or  edema.  Neurologic Exam  Mental status: The patient is alert and oriented x 3 at the time of the examination. The patient has apparent normal recent and remote memory, with an apparently normal attention span and concentration ability.   Speech is normal.  Cranial nerves: Extraocular movements are full.  Facial strength and sensation was normal.  Hearing appeared to be normal.  Motor:  Muscle bulk is normal.   Tone is normal. Strength is  5 / 5 in all 4 extremities.   Sensory: Today, there was intact sensation to touch and temperature in the thorax.  Vibration sensation was normal and symmetric.  Coordination: Cerebellar testing reveals good finger-nose-finger and heel-to-shin bilaterally.  Gait and station: Station is normal.   Gait is normal. Tandem gait is normal for age. Romberg is negative.   Reflexes: Deep tendon reflexes are symmetric and normal bilaterally.       DIAGNOSTIC DATA (LABS, IMAGING, TESTING) - I reviewed patient records, labs, notes, testing and imaging myself where available.  Lab Results  Component Value Date   WBC 11.0 (H) 02/10/2020   HGB 16.4 02/10/2020   HCT 47.8 02/10/2020   MCV 94 02/10/2020   PLT 261 02/10/2020      Component Value Date/Time   NA 141 09/16/2020 0959   NA 135 (L) 10/10/2012 1120   K 4.6 09/16/2020 0959   K 5.5 (H) 10/10/2012 1120   CL 103 09/16/2020 0959   CL 106 10/10/2012 1120   CO2 23 09/16/2020 0959   CO2 24 10/10/2012 1120    GLUCOSE 95 09/16/2020 0959   GLUCOSE 93 10/10/2012 1120   BUN 15 09/16/2020 0959   BUN 11 10/10/2012 1120   CREATININE 0.76 09/16/2020 0959   CREATININE 0.85 10/10/2012 1120   CALCIUM 9.2 09/16/2020 0959   CALCIUM 8.5 10/10/2012 1120   PROT 6.7 09/16/2020 0959   PROT 7.6 10/10/2012 1120   ALBUMIN 4.6 09/16/2020 0959   ALBUMIN 3.6 10/10/2012 1120   AST 29 09/16/2020 0959   AST 50 (H) 10/10/2012 1120   ALT 82 (H) 09/16/2020 0959   ALT 43 10/10/2012 1120   ALKPHOS 74 09/16/2020 0959   ALKPHOS 70 10/10/2012 1120   BILITOT 0.3 09/16/2020 0959   BILITOT 0.5 10/10/2012 1120   GFRNONAA 95 02/10/2020 1127   GFRNONAA >60 10/10/2012 1120   GFRAA 109 02/10/2020 1127   GFRAA >60 10/10/2012 1120   Lab Results  Component Value Date   CHOL 129 09/16/2020   HDL 51 09/16/2020   LDLCALC 67 09/16/2020   TRIG 45 09/16/2020   CHOLHDL 2.5 09/16/2020   Lab Results  Component Value Date   HGBA1C 6.2 (A) 09/16/2020   No results found for: VITAMINB12 Lab Results  Component Value Date   TSH 5.740 (H) 09/16/2020       ASSESSMENT AND PLAN  Multiple sclerosis ([place]) - Plan: CBC with Differential/Platelet, Hepatic function panel  Numbness  Elevated liver enzymes - Plan: Hepatic function panel  High risk medication use - Plan: CBC with Differential/Platelet, Hepatic function panel    1.  Continue dimethyl fumarate.  We will check CBC and liver function test today.  Consider repeat imaging later this year. 2.  Stay active. 3.   Return in 6 months or call sooner if new or worsening neurologic symptoms.     Babyboy Loya A. Felecia Shelling, MD, [facility] [id number], 0000000 PM Certified in Neurology, Clinical Neurophysiology, Sleep Medicine and Neuroimaging  [facility] [address] [place], [place] [address] [phone]

## 2020-09-23 LAB — HEPATIC FUNCTION PANEL
ALT: 39 IU/L (ref 0–44)
AST: 15 IU/L (ref 0–40)
Albumin: 4.6 g/dL (ref 3.8–4.9)
Alkaline Phosphatase: 66 IU/L (ref 44–121)
Bilirubin Total: 0.4 mg/dL (ref 0.0–1.2)
Bilirubin, Direct: 0.15 mg/dL (ref 0.00–0.40)
Total Protein: 7.1 g/dL (ref 6.0–8.5)

## 2020-09-23 LAB — CBC WITH DIFFERENTIAL/PLATELET
Basophils Absolute: 0.1 10*3/uL (ref 0.0–0.2)
Basos: 1 %
EOS (ABSOLUTE): 0.3 10*3/uL (ref 0.0–0.4)
Eos: 3 %
Hematocrit: 44.2 % (ref 37.5–51.0)
Hemoglobin: 14.7 g/dL (ref 13.0–17.7)
Immature Grans (Abs): 0 10*3/uL (ref 0.0–0.1)
Immature Granulocytes: 0 %
Lymphocytes Absolute: 3.4 10*3/uL — ABNORMAL HIGH (ref 0.7–3.1)
Lymphs: 38 %
MCH: 30.6 pg (ref 26.6–33.0)
MCHC: 33.3 g/dL (ref 31.5–35.7)
MCV: 92 fL (ref 79–97)
Monocytes Absolute: 1.1 10*3/uL — ABNORMAL HIGH (ref 0.1–0.9)
Monocytes: 12 %
Neutrophils Absolute: 4.1 10*3/uL (ref 1.4–7.0)
Neutrophils: 46 %
Platelets: 271 10*3/uL (ref 150–450)
RBC: 4.81 x10E6/uL (ref 4.14–5.80)
RDW: 13.1 % (ref 11.6–15.4)
WBC: 9 10*3/uL (ref 3.4–10.8)

## 2020-09-30 ENCOUNTER — Other Ambulatory Visit: Payer: Self-pay | Admitting: Neurology

## 2020-10-01 ENCOUNTER — Telehealth: Payer: Self-pay

## 2020-10-01 DIAGNOSIS — E119 Type 2 diabetes mellitus without complications: Secondary | ICD-10-CM

## 2020-10-01 DIAGNOSIS — E1165 Type 2 diabetes mellitus with hyperglycemia: Secondary | ICD-10-CM

## 2020-10-01 MED ORDER — METFORMIN HCL 1000 MG PO TABS: 1000.0000 mg | ORAL_TABLET | Freq: Two times a day (BID) | ORAL | 2 refills | Status: DC

## 2020-10-01 NOTE — Telephone Encounter (Signed)
[facility] faxed refill request for the following medications:  1. metFORMIN (GLUCOPHAGE) 500 MG tablet Take 1 tablet by mouth every day with breakfast This dosage is not on current medication list.   2. atorvastatin (LIPITOR) 10 MG tablet   Please advise.

## 2020-10-08 MED ORDER — ATORVASTATIN CALCIUM 10 MG PO TABS: 10.0000 mg | ORAL_TABLET | Freq: Every day | ORAL | 2 refills | Status: DC

## 2020-10-08 NOTE — Telephone Encounter (Signed)
Pt is calling to request if this medication can be resent. Please advise

## 2020-10-08 NOTE — Addendum Note (Signed)
Addended by: Shawna Orleans on: 10/08/2020 04:55 PM   Modules accepted: Orders

## 2021-01-11 ENCOUNTER — Telehealth: Payer: Self-pay | Admitting: Family Medicine

## 2021-01-11 DIAGNOSIS — E039 Hypothyroidism, unspecified: Secondary | ICD-10-CM

## 2021-01-11 MED ORDER — LEVOTHYROXINE SODIUM 75 MCG PO TABS: 75.0000 ug | ORAL_TABLET | Freq: Every day | ORAL | 12 refills | Status: DC

## 2021-01-11 NOTE — Telephone Encounter (Signed)
[facility] faxed refill request for the following medications:    levothyroxine (SYNTHROID) 75 MCG tablet   Please advise.

## 2021-02-03 ENCOUNTER — Telehealth: Payer: Self-pay | Admitting: Neurology

## 2021-02-03 MED ORDER — DIMETHYL FUMARATE 240 MG PO CPDR
DELAYED_RELEASE_CAPSULE | ORAL | 4 refills | Status: DC
Start: 2021-02-03 — End: 2021-02-03

## 2021-02-03 MED ORDER — DIMETHYL FUMARATE 240 MG PO CPDR: DELAYED_RELEASE_CAPSULE | ORAL | 4 refills | Status: AC

## 2021-02-03 NOTE — Telephone Encounter (Signed)
Pt called wanting to inform the provider that he has switched jobs therefore  he will not have insurance to cover for his Dimethyl Fumarate 120 MG CPDR Dimethyl Fumarate 240 MG CPDR and he is wanting to discuss this with provider or RN. Please advise.

## 2021-02-03 NOTE — Addendum Note (Signed)
Addended by: Darleen Crocker on: 02/03/2021 11:09 AM   Modules accepted: Orders

## 2021-02-03 NOTE — Telephone Encounter (Signed)
Called the patient back.  Advised the patient of this option.  Informed the patient that I had not attempted this with another patient at this time but it is giving me the option to enter in good [email or web address].  When I do that the cheapest that pops up is that [employer].  Patient had reduced his medication to 1 a day due to the thought of being unable to afford the medicine going forward.  Advised that I will send a prescription over to [facility] along with a good Rx coupon in hopes that it will work and the patient can get the medication going forward.  Patient was appreciative for this information and will give [place] a call back if there is problems.

## 2021-02-03 NOTE — Telephone Encounter (Signed)
An option that can be offered to the patient during the time without insurance is getting a month supply from [facility] for 36.82$

## 2021-02-08 NOTE — Telephone Encounter (Signed)
FYI- Pt wanted to call and give some feedback he was able to get his Dimethyl Fumarate 240 MG CPDR with the [facility]. Wanting to thank [place] for our help and he really appreciated it.

## 2021-02-08 NOTE — Telephone Encounter (Signed)
Noted  

## 2021-03-21 ENCOUNTER — Ambulatory Visit: Payer: Self-pay | Admitting: Family Medicine

## 2021-03-30 ENCOUNTER — Ambulatory Visit: Payer: Self-pay | Admitting: Neurology

## 2021-06-03 NOTE — Progress Notes (Deleted)
Established patient visit   Patient: Benjamin Oliver   DOB: 09/06/1965   56 y.o. Male  MRN: [id number] Visit Date: 06/06/2021  Today's healthcare provider: Lavon Paganini, MD   No chief complaint on file.  Subjective    HPI  Diabetes Mellitus Type II, follow-up  Lab Results  Component Value Date   HGBA1C 6.2 (A) 09/16/2020   HGBA1C 6.7 (A) 05/26/2020   HGBA1C 8.2 (H) 04/13/2020   Last seen for diabetes 8 months ago.  Management since then includes continuing the same treatment. He reports excellent compliance with treatment. He is not having side effects.   Home blood sugar records: {diabetes glucometry results:16657}  Episodes of hypoglycemia? {Yes/No:20286} {enter details if yes:1}   Current insulin regiment: NONE Most Recent Eye Exam: UTD  --------------------------------------------------------------------------------------------------- Lipid/Cholesterol, follow-up  Last Lipid Panel: Lab Results  Component Value Date   CHOL 129 09/16/2020   LDLCALC 67 09/16/2020   HDL 51 09/16/2020   TRIG 45 09/16/2020    He was last seen for this 8 months ago.  Management since that visit includes NO CHANGES.  He reports excellent compliance with treatment. He is not having side effects.   Symptoms: {Yes/No:20286} appetite changes {Yes/No:20286} foot ulcerations  {Yes/No:20286} chest pain {Yes/No:20286} chest pressure/discomfort  {Yes/No:20286} dyspnea {Yes/No:20286} orthopnea  {Yes/No:20286} fatigue {Yes/No:20286} lower extremity edema  {Yes/No:20286} palpitations {Yes/No:20286} paroxysmal nocturnal dyspnea  {Yes/No:20286} nausea {Yes/No:20286} numbness or tingling of extremity  {Yes/No:20286} polydipsia {Yes/No:20286} polyuria  {Yes/No:20286} speech difficulty {Yes/No:20286} syncope   He is following a {diet:21022986} diet. Current exercise: {exercise UTMLY:65035}  Last metabolic panel Lab Results  Component Value Date   GLUCOSE 95 09/16/2020   NA  141 09/16/2020   K 4.6 09/16/2020   BUN 15 09/16/2020   CREATININE 0.76 09/16/2020   EGFR 105 09/16/2020   GFRNONAA 95 02/10/2020   CALCIUM 9.2 09/16/2020   AST 15 09/22/2020   ALT 39 09/22/2020   The ASCVD Risk score (Arnett DK, et al., 2019) failed to calculate for the following reasons:   The valid total cholesterol range is 130 to 320 mg/dL  --------------------------------------------------------------------------------------------------- Hypothyroid, follow-up  Lab Results  Component Value Date   TSH 5.740 (H) 09/16/2020   TSH 3.620 04/13/2020   TSH 15.400 (H) 02/10/2020   FREET4 0.74 (L) 09/11/2017   T4TOTAL 6.4 05/31/2018    Wt Readings from Last 3 Encounters:  09/22/20 201 lb (91.2 kg)  09/16/20 205 lb 3.2 oz (93.1 kg)  06/14/20 222 lb (100.7 kg)    He was last seen for hypothyroid 8 months ago.  Management since that visit includes NO CHANGES. He reports excellent compliance with treatment. He is not having side effects.   Symptoms: {Yes/No:20286} change in energy level {Yes/No:20286} constipation  {Yes/No:20286} diarrhea {Yes/No:20286} heat / cold intolerance  {Yes/No:20286} nervousness {Yes/No:20286} palpitations  {Yes/No:20286} weight changes    -----------------------------------------------------------------------------------------   Medications: Outpatient Medications Prior to Visit  Medication Sig   acetaminophen (TYLENOL) 500 MG tablet Take 1,000-1,500 mg by mouth every 8 (eight) hours as needed for moderate pain.   Ascorbic Acid (VITAMIN C) 1000 MG tablet Take 1,000 mg by mouth daily.   atorvastatin (LIPITOR) 10 MG tablet Take 1 tablet (10 mg total) by mouth daily.   calcium carbonate (TUMS - DOSED IN MG ELEMENTAL CALCIUM) 500 MG chewable tablet Chew 1,000-1,500 mg by mouth daily as needed for indigestion or heartburn.   cetirizine (ZYRTEC) 10 MG tablet Take 1 tablet (10 mg total) by mouth daily.  diclofenac Sodium (VOLTAREN) 1 % GEL Apply 4 g  topically 4 (four) times daily.   Dimethyl Fumarate 240 MG CPDR TAKE 1 CAPSULE (240 MG TOTAL) TWICE A DAY   fluticasone (FLONASE) 50 MCG/ACT nasal spray Place 2 sprays into both nostrils daily.   ibuprofen (ADVIL) 200 MG tablet Take 400-800 mg by mouth every 6 (six) hours as needed for moderate pain.   levothyroxine (SYNTHROID) 75 MCG tablet Take 1 tablet (75 mcg total) by mouth daily.   loratadine (CLARITIN) 10 MG tablet Take 10 mg by mouth daily as needed for allergies.   meloxicam (MOBIC) 15 MG tablet Take 1 tablet (15 mg total) by mouth daily.   metFORMIN (GLUCOPHAGE) 1000 MG tablet Take 1 tablet (1,000 mg total) by mouth 2 (two) times daily with a meal.   Multiple Vitamin (MULTIVITAMIN WITH MINERALS) TABS tablet Take 1 tablet by mouth daily.   predniSONE (STERAPRED UNI-PAK 48 TAB) 10 MG (48) TBPK tablet Take 2 tablets by mouth 2 (two) times daily. (Patient not taking: Reported on 09/22/2020)   VITAMIN D PO Take 1 capsule by mouth daily.   Facility-Administered Medications Prior to Visit  Medication Dose Route Frequency Provider   betamethasone acetate-betamethasone sodium phosphate (CELESTONE) injection 3 mg  3 mg Intramuscular Once Edrick Kins, DPM    Review of Systems  Constitutional:  Negative for appetite change, fatigue and unexpected weight change.  Eyes:  Negative for visual disturbance.  Respiratory:  Negative for chest tightness and shortness of breath.   Cardiovascular:  Negative for chest pain and palpitations.  Endocrine: Negative for cold intolerance and heat intolerance.       Objective    There were no vitals taken for this visit. BP Readings from Last 3 Encounters:  09/22/20 106/62  09/16/20 113/73  06/14/20 118/75   Wt Readings from Last 3 Encounters:  09/22/20 201 lb (91.2 kg)  09/16/20 205 lb 3.2 oz (93.1 kg)  06/14/20 222 lb (100.7 kg)      Physical Exam  ***  No results found for any visits on 06/06/21.  Assessment & Plan     ***  No  follow-ups on file.      {provider attestation***:1}   Lavon Paganini, MD  [facility] [phone] (phone) [phone] (fax)  [place]

## 2021-06-06 ENCOUNTER — Ambulatory Visit: Payer: Self-pay | Admitting: Family Medicine

## 2021-06-06 DIAGNOSIS — E663 Overweight: Secondary | ICD-10-CM

## 2021-06-06 DIAGNOSIS — E039 Hypothyroidism, unspecified: Secondary | ICD-10-CM

## 2021-06-06 DIAGNOSIS — E1169 Type 2 diabetes mellitus with other specified complication: Secondary | ICD-10-CM

## 2021-06-29 ENCOUNTER — Other Ambulatory Visit: Payer: Self-pay | Admitting: Family Medicine

## 2021-06-29 DIAGNOSIS — E1165 Type 2 diabetes mellitus with hyperglycemia: Secondary | ICD-10-CM

## 2021-06-29 NOTE — Telephone Encounter (Signed)
Requested Prescriptions  Pending Prescriptions Disp Refills   atorvastatin (LIPITOR) 10 MG tablet [Pharmacy Med Name: ATORVASTATIN 10 MG TABLET] 90 tablet 0    Sig: TAKE 1 TABLET BY MOUTH EVERY DAY     Cardiovascular:  Antilipid - Statins Failed - 06/29/2021  1:46 AM      Failed - Lipid Panel in normal range within the last 12 months    Cholesterol, Total  Date Value Ref Range Status  09/16/2020 129 100 - 199 mg/dL Final   LDL Chol Calc (NIH)  Date Value Ref Range Status  09/16/2020 67 0 - 99 mg/dL Final   HDL  Date Value Ref Range Status  09/16/2020 51 >39 mg/dL Final   Triglycerides  Date Value Ref Range Status  09/16/2020 45 0 - 149 mg/dL Final         Passed - Patient is not pregnant      Passed - Valid encounter within last 12 months    Recent Outpatient Visits          9 months ago Type 2 diabetes mellitus with other specified complication, without long-term current use of insulin ([place])   [facility] [place], Dionne Bucy, MD   1 year ago Controlled type 2 diabetes mellitus without complication, without long-term current use of insulin [facility])   [facility] [place], [place], PA-C   1 year ago Type 2 diabetes mellitus with hyperglycemia, without long-term current use of insulin [facility])   [facility] [place], Wendee Beavers, [place]   1 year ago Thoracic myelopathy   [facility] [place], Wendee Beavers, [place]   1 year ago Annual physical exam   [facility] [place], [place]      Future Appointments            In 1 month [place], Dionne Bucy, MD [facility], [facility]

## 2021-08-03 ENCOUNTER — Telehealth: Payer: Self-pay | Admitting: Family Medicine

## 2021-08-03 DIAGNOSIS — E119 Type 2 diabetes mellitus without complications: Secondary | ICD-10-CM

## 2021-08-03 NOTE — Telephone Encounter (Signed)
Medication Refill - Medication: metFORMIN (GLUCOPHAGE) 1000 MG tablet  ?Pt also wants Nicotine patches  ? ?Has the patient contacted their pharmacy? Yes.   ?(Agent: If no, request that the patient contact the pharmacy for the refill. If patient does not wish to contact the pharmacy document the reason why and proceed with request.) ?(Agent: If yes, when and what did the pharmacy advise?) ? ?Preferred Pharmacy (with phone number or street name):  ?[id number] [address] [place] [id number] ?Phone: [phone]: [phone] ? ?Has the patient been seen for an appointment in the last year OR does the patient have an upcoming appointment? Yes.   ? ?Agent: Please be advised that RX refills may take up to 3 business days. We ask that you follow-up with your pharmacy. ? ?

## 2021-08-04 MED ORDER — NICOTINE 14 MG/24HR TD PT24: 14.0000 mg | MEDICATED_PATCH | Freq: Every day | TRANSDERMAL | 0 refills | Status: DC

## 2021-08-04 MED ORDER — METFORMIN HCL 1000 MG PO TABS: 1000.0000 mg | ORAL_TABLET | Freq: Two times a day (BID) | ORAL | 0 refills | Status: DC

## 2021-08-05 ENCOUNTER — Ambulatory Visit: Payer: Self-pay | Admitting: Family Medicine

## 2021-08-05 NOTE — Telephone Encounter (Signed)
Left detailed message for patient to call back to schedule appt sooner. PEC please schedule with new app.  ?

## 2021-08-11 ENCOUNTER — Ambulatory Visit: Payer: Self-pay | Admitting: Neurology

## 2021-08-13 ENCOUNTER — Other Ambulatory Visit: Payer: Self-pay | Admitting: Family Medicine

## 2021-08-13 DIAGNOSIS — E1165 Type 2 diabetes mellitus with hyperglycemia: Secondary | ICD-10-CM

## 2021-09-05 ENCOUNTER — Encounter: Payer: Self-pay | Admitting: Family Medicine

## 2021-09-05 ENCOUNTER — Ambulatory Visit (INDEPENDENT_AMBULATORY_CARE_PROVIDER_SITE_OTHER): Payer: BC Managed Care – PPO | Admitting: Family Medicine

## 2021-09-05 VITALS — BP 123/82 | HR 69 | Temp 98.4°F | Resp 16 | Wt 212.7 lb

## 2021-09-05 DIAGNOSIS — Z720 Tobacco use: Secondary | ICD-10-CM

## 2021-09-05 DIAGNOSIS — E1169 Type 2 diabetes mellitus with other specified complication: Secondary | ICD-10-CM

## 2021-09-05 DIAGNOSIS — E1140 Type 2 diabetes mellitus with diabetic neuropathy, unspecified: Secondary | ICD-10-CM | POA: Diagnosis not present

## 2021-09-05 DIAGNOSIS — E039 Hypothyroidism, unspecified: Secondary | ICD-10-CM | POA: Insufficient documentation

## 2021-09-05 DIAGNOSIS — E785 Hyperlipidemia, unspecified: Secondary | ICD-10-CM

## 2021-09-05 MED ORDER — METFORMIN HCL 1000 MG PO TABS: 1000.0000 mg | ORAL_TABLET | Freq: Two times a day (BID) | ORAL | 1 refills | Status: DC

## 2021-09-05 NOTE — Assessment & Plan Note (Signed)
Chronic, previously stable with metformin 1000 mg BID ?Refilled today ?Repeat a1c ?Due for DM eye exam ?On statin ?Not on Ace/Arb ?Urine micro completed ?Encouraged smoking cessation ?Denies foot concerns for sores- hx of nerve involvement with worsening neuropathy qHS ?Repot hx of plantar fascitis as well; stable with use of orthotics per pt report  ?

## 2021-09-05 NOTE — Progress Notes (Signed)
?  ?[employer] as a Education administrator for Gwyneth Sprout, FNP.,have documented all relevant documentation on the behalf of Gwyneth Sprout, FNP,as directed by  Gwyneth Sprout, FNP while in the presence of Gwyneth Sprout, FNP.  ? ?Established patient visit ? ? ?Patient: Benjamin Oliver   DOB: 02-23-1966   56 y.o. Male  MRN: [id number] ?Visit Date: 09/05/2021 ? ?Today's healthcare provider: Gwyneth Sprout, FNP  ? ?Introduced to Designer, jewellery role and practice setting.  All questions answered.  Discussed provider/patient relationship and expectations. ? ? ?Chief Complaint  ?Patient presents with  ? Diabetes  ? Hyperlipidemia  ? Hypothyroidism  ? ?Subjective  ?  ?HPI  ?Diabetes Mellitus Type II, Follow-up ? ?Lab Results  ?Component Value Date  ? HGBA1C 6.2 (A) 09/16/2020  ? HGBA1C 6.7 (A) 05/26/2020  ? HGBA1C 8.2 (H) 04/13/2020  ? ?Wt Readings from Last 3 Encounters:  ?09/05/21 212 lb 11.2 oz (96.5 kg)  ?09/22/20 201 lb (91.2 kg)  ?09/16/20 205 lb 3.2 oz (93.1 kg)  ? ?Last seen for diabetes 11 months ago.  ?Management since then includes none. ?He reports excellent compliance with treatment. ?He is not having side effects.  ?Symptoms: ?No fatigue No foot ulcerations  ?No appetite changes No nausea  ?Yes paresthesia of the feet  No polydipsia  ?No polyuria No visual disturbances   ?No vomiting   ? ? ?Home blood sugar records:  not checked ? ?Episodes of hypoglycemia? No   ?  ?Current insulin regiment: n/a ?Most Recent Eye Exam: within 12 months ?Current exercise: no regular exercise ?Current diet habits: on average, 2-3 meals per day ? ?Pertinent Labs: ?Lab Results  ?Component Value Date  ? CHOL 129 09/16/2020  ? HDL 51 09/16/2020  ? [place] 67 09/16/2020  ? TRIG 45 09/16/2020  ? CHOLHDL 2.5 09/16/2020  ? Lab Results  ?Component Value Date  ? NA 141 09/16/2020  ? K 4.6 09/16/2020  ? CREATININE 0.76 09/16/2020  ? EGFR 105 09/16/2020  ? LABMICR <3.0 04/13/2020  ?   ? ?---------------------------------------------------------------------------------------------------  ?Lipid/Cholesterol, Follow-up ? ?Last lipid panel Other pertinent labs  ?Lab Results  ?Component Value Date  ? CHOL 129 09/16/2020  ? HDL 51 09/16/2020  ? [place] 67 09/16/2020  ? TRIG 45 09/16/2020  ? CHOLHDL 2.5 09/16/2020  ? Lab Results  ?Component Value Date  ? ALT 39 09/22/2020  ? AST 15 09/22/2020  ? PLT 271 09/22/2020  ? TSH 5.740 (H) 09/16/2020  ?  ? ?He was last seen for this 11 months ago.  ?Management since that visit includes none. ? ?He reports excellent compliance with treatment. ?He is not having side effects.  ? ?Symptoms: ?No chest pain No chest pressure/discomfort  ?No dyspnea No lower extremity edema  ?No numbness or tingling of extremity No orthopnea  ?No palpitations No paroxysmal nocturnal dyspnea  ?No speech difficulty No syncope  ? ?Current diet: on average, 2-3 meals per day ?Current exercise: no regular exercise ? ?The ASCVD Risk score (Arnett DK, et al., 2019) failed to calculate for the following reasons: ?  The valid total cholesterol range is 130 to 320 mg/dL ? ?---------------------------------------------------------------------------------------------------  ?Hypothyroid, follow-up ? ?Lab Results  ?Component Value Date  ? TSH 5.740 (H) 09/16/2020  ? TSH 3.620 04/13/2020  ? TSH 15.400 (H) 02/10/2020  ? FREET4 0.74 (L) 09/11/2017  ? T4TOTAL 6.4 05/31/2018  ? ? ?Wt Readings from Last 3 Encounters:  ?09/05/21 212 lb 11.2 oz (96.5 kg)  ?  09/22/20 201 lb (91.2 kg)  ?09/16/20 205 lb 3.2 oz (93.1 kg)  ? ? ?He was last seen for hypothyroid 11 months ago.  ?Management since that visit includes none. ?He reports excellent compliance with treatment. ?He is not having side effects.  ? ?Symptoms: ?No change in energy level No constipation  ?No diarrhea No heat / cold intolerance  ?No nervousness No palpitations  ?No weight changes    ? ?-----------------------------------------------------------------------------------------  ? ?Medications: ?Outpatient Medications Prior to Visit  ?Medication Sig  ? acetaminophen (TYLENOL) 500 MG tablet Take 1,000-1,500 mg by mouth every 8 (eight) hours as needed for moderate pain.  ? Ascorbic Acid (VITAMIN C) 1000 MG tablet Take 1,000 mg by mouth daily.  ? atorvastatin (LIPITOR) 10 MG tablet TAKE 1 TABLET BY MOUTH EVERY DAY  ? calcium carbonate (TUMS - DOSED IN MG ELEMENTAL CALCIUM) 500 MG chewable tablet Chew 1,000-1,500 mg by mouth daily as needed for indigestion or heartburn.  ? cetirizine (ZYRTEC) 10 MG tablet Take 1 tablet (10 mg total) by mouth daily.  ? diclofenac Sodium (VOLTAREN) 1 % GEL Apply 4 g topically 4 (four) times daily.  ? Dimethyl Fumarate 240 MG CPDR TAKE 1 CAPSULE (240 MG TOTAL) TWICE A DAY  ? fluticasone (FLONASE) 50 MCG/ACT nasal spray Place 2 sprays into both nostrils daily.  ? ibuprofen (ADVIL) 200 MG tablet Take 400-800 mg by mouth every 6 (six) hours as needed for moderate pain.  ? levothyroxine (SYNTHROID) 75 MCG tablet Take 1 tablet (75 mcg total) by mouth daily.  ? loratadine (CLARITIN) 10 MG tablet Take 10 mg by mouth daily as needed for allergies.  ? meloxicam (MOBIC) 15 MG tablet Take 1 tablet (15 mg total) by mouth daily.  ? Multiple Vitamin (MULTIVITAMIN WITH MINERALS) TABS tablet Take 1 tablet by mouth daily.  ? VITAMIN D PO Take 1 capsule by mouth daily.  ? [DISCONTINUED] metFORMIN (GLUCOPHAGE) 1000 MG tablet Take 1 tablet (1,000 mg total) by mouth 2 (two) times daily with a meal. Please schedule office visit before any future refill.  ? [DISCONTINUED] nicotine (NICODERM CQ - DOSED IN MG/24 HOURS) 14 mg/24hr patch Place 1 patch (14 mg total) onto the skin daily. (Patient not taking: Reported on 09/05/2021)  ? [DISCONTINUED] predniSONE (STERAPRED UNI-PAK 48 TAB) 10 MG (48) TBPK tablet Take 2 tablets by mouth 2 (two) times daily. (Patient not taking: Reported on 09/05/2021)   ? ?Facility-Administered Medications Prior to Visit  ?Medication Dose Route Frequency Provider  ? betamethasone acetate-betamethasone sodium phosphate (CELESTONE) injection 3 mg  3 mg Intramuscular Once Edrick Kins, DPM  ? ? ?Review of Systems ? ?Last CBC ?Lab Results  ?Component Value Date  ? WBC 9.0 09/22/2020  ? HGB 14.7 09/22/2020  ? HCT 44.2 09/22/2020  ? MCV 92 09/22/2020  ? MCH 30.6 09/22/2020  ? RDW 13.1 09/22/2020  ? PLT 271 09/22/2020  ? ?Last metabolic panel ?Lab Results  ?Component Value Date  ? GLUCOSE 95 09/16/2020  ? NA 141 09/16/2020  ? K 4.6 09/16/2020  ? CL 103 09/16/2020  ? CO2 23 09/16/2020  ? BUN 15 09/16/2020  ? CREATININE 0.76 09/16/2020  ? EGFR 105 09/16/2020  ? CALCIUM 9.2 09/16/2020  ? PROT 7.1 09/22/2020  ? ALBUMIN 4.6 09/22/2020  ? LABGLOB 2.1 09/16/2020  ? AGRATIO 2.2 09/16/2020  ? BILITOT 0.4 09/22/2020  ? ALKPHOS 66 09/22/2020  ? AST 15 09/22/2020  ? ALT 39 09/22/2020  ? ANIONGAP 5 (L) 10/10/2012  ? ?Last  lipids ?Lab Results  ?Component Value Date  ? CHOL 129 09/16/2020  ? HDL 51 09/16/2020  ? [place] 67 09/16/2020  ? TRIG 45 09/16/2020  ? CHOLHDL 2.5 09/16/2020  ? ?Last hemoglobin A1c ?Lab Results  ?Component Value Date  ? HGBA1C 6.2 (A) 09/16/2020  ? ?Last thyroid functions ?Lab Results  ?Component Value Date  ? TSH 5.740 (H) 09/16/2020  ? T4TOTAL 6.4 05/31/2018  ? ?Last vitamin D ?No results found for: 25OHVITD2, [place], VD25OH ?Last vitamin B12 and Folate ?No results found for: VITAMINB12, FOLATE ?  ?  Objective  ?  ?BP 123/82   Pulse 69   Temp 98.4 ?F (36.9 ?C) (Oral)   Resp 16   Wt 212 lb 11.2 oz (96.5 kg)   SpO2 97%   BMI 28.85 kg/m?  ?BP Readings from Last 3 Encounters:  ?09/05/21 123/82  ?09/22/20 106/62  ?09/16/20 113/73  ? ?Wt Readings from Last 3 Encounters:  ?09/05/21 212 lb 11.2 oz (96.5 kg)  ?09/22/20 201 lb (91.2 kg)  ?09/16/20 205 lb 3.2 oz (93.1 kg)  ? ?SpO2 Readings from Last 3 Encounters:  ?09/05/21 97%  ?09/16/20 98%  ?05/26/20 98%  ? ?  ? ?Physical  Exam ?Vitals and nursing note reviewed.  ?Constitutional:   ?   Appearance: Normal appearance. He is overweight.  ?HENT:  ?   Head: Normocephalic and atraumatic.  ?Eyes:  ?   Pupils: Pupils are equal, round, and reactive to light.  ?Cardiovascular:  ?   Rate and Rhythm: Normal rate and regular rhythm.  ?   Pulses: Normal pulses.

## 2021-09-05 NOTE — Assessment & Plan Note (Signed)
Chronic, improving ?Wishes to use patches to assist ?2 ppd now 1 ppd d/t lack of availability to smoke at job ?14 mg patch previously supplied to assist ?Does not like gum/lozenges  ?Does not wish to start oral medication like Wellbutrin/chantix at this time ?

## 2021-09-05 NOTE — Assessment & Plan Note (Signed)
Chronic, previously stable with use of lipitor ?Repeat NFLP and CMP ?We recommend diet low in saturated fat and regular exercise - 30 min at least 5 times per week ?LDL goal of <70 with DM ?Will refill/change medication based on lab results ?

## 2021-09-05 NOTE — Assessment & Plan Note (Signed)
Chronic, previously stable on 75 mcg ?Repeat TSH and free T4 ?Will adjust medication as needed ?Denies symptoms related to irregularity of thyroid control ?

## 2021-09-06 ENCOUNTER — Other Ambulatory Visit: Payer: Self-pay | Admitting: Family Medicine

## 2021-09-06 DIAGNOSIS — E1165 Type 2 diabetes mellitus with hyperglycemia: Secondary | ICD-10-CM

## 2021-09-06 DIAGNOSIS — E039 Hypothyroidism, unspecified: Secondary | ICD-10-CM

## 2021-09-06 LAB — COMPREHENSIVE METABOLIC PANEL
ALT: 20 IU/L (ref 0–44)
AST: 16 IU/L (ref 0–40)
Albumin/Globulin Ratio: 2.6 — ABNORMAL HIGH (ref 1.2–2.2)
Albumin: 4.7 g/dL (ref 3.8–4.9)
Alkaline Phosphatase: 73 IU/L (ref 44–121)
BUN/Creatinine Ratio: 19 (ref 9–20)
BUN: 16 mg/dL (ref 6–24)
Bilirubin Total: <0.2 mg/dL (ref 0.0–1.2)
CO2: 22 mmol/L (ref 20–29)
Calcium: 9.2 mg/dL (ref 8.7–10.2)
Chloride: 103 mmol/L (ref 96–106)
Creatinine, Ser: 0.85 mg/dL (ref 0.76–1.27)
Globulin, Total: 1.8 g/dL (ref 1.5–4.5)
Glucose: 86 mg/dL (ref 70–99)
Potassium: 4.3 mmol/L (ref 3.5–5.2)
Sodium: 140 mmol/L (ref 134–144)
Total Protein: 6.5 g/dL (ref 6.0–8.5)
eGFR: 101 mL/min/{1.73_m2} (ref >59)

## 2021-09-06 LAB — HEMOGLOBIN A1C
Est. average glucose Bld gHb Est-mCnc: 137 mg/dL
Hgb A1c MFr Bld: 6.4 % — ABNORMAL HIGH (ref 4.8–5.6)

## 2021-09-06 LAB — LIPID PANEL
Chol/HDL Ratio: 3.1 ratio (ref 0.0–5.0)
Cholesterol, Total: 138 mg/dL (ref 100–199)
HDL: 45 mg/dL (ref >39)
LDL Chol Calc (NIH): 71 mg/dL (ref 0–99)
Triglycerides: 124 mg/dL (ref 0–149)
VLDL Cholesterol Cal: 22 mg/dL (ref 5–40)

## 2021-09-06 LAB — TSH+FREE T4
Free T4: 1.05 ng/dL (ref 0.82–1.77)
TSH: 10.7 u[IU]/mL — ABNORMAL HIGH (ref 0.450–4.500)

## 2021-09-06 LAB — MICROALBUMIN / CREATININE URINE RATIO
Creatinine, Urine: 103.8 mg/dL
Microalb/Creat Ratio: 3 mg/g creat (ref 0–29)
Microalbumin, Urine: 3 ug/mL

## 2021-09-06 MED ORDER — LEVOTHYROXINE SODIUM 112 MCG PO TABS: 112.0000 ug | ORAL_TABLET | Freq: Every day | ORAL | 0 refills | Status: DC

## 2021-09-06 MED ORDER — ATORVASTATIN CALCIUM 10 MG PO TABS: 10.0000 mg | ORAL_TABLET | Freq: Every day | ORAL | 1 refills | Status: DC

## 2021-09-07 ENCOUNTER — Other Ambulatory Visit: Payer: Self-pay

## 2021-09-07 DIAGNOSIS — E039 Hypothyroidism, unspecified: Secondary | ICD-10-CM

## 2021-09-19 ENCOUNTER — Ambulatory Visit: Payer: Self-pay | Admitting: Family Medicine

## 2021-10-18 ENCOUNTER — Telehealth: Payer: Self-pay | Admitting: Neurology

## 2021-10-18 NOTE — Telephone Encounter (Signed)
LVM and sent mychart msg informing pt of r/s needed for 6/29 appt- MD out.

## 2021-10-20 ENCOUNTER — Ambulatory Visit: Payer: Self-pay | Admitting: Family Medicine

## 2021-11-17 ENCOUNTER — Ambulatory Visit: Payer: Self-pay | Admitting: Neurology

## 2021-11-18 NOTE — Progress Notes (Deleted)
Established patient visit   Patient: Benjamin Oliver   DOB: [birth year]   56 y.o. Male  MRN: [id number] Visit Date: 12/06/2021  Today's healthcare provider: Gwyneth Sprout, FNP   No chief complaint on file.  Subjective    HPI  Diabetes Mellitus Type II, Follow-up  Lab Results  Component Value Date   HGBA1C 6.4 (H) 09/05/2021   HGBA1C 6.2 (A) 09/16/2020   HGBA1C 6.7 (A) 05/26/2020   Wt Readings from Last 3 Encounters:  09/05/21 212 lb 11.2 oz (96.5 kg)  09/22/20 201 lb (91.2 kg)  09/16/20 205 lb 3.2 oz (93.1 kg)   Last seen for diabetes 3 months ago.  Management since then includes continue metformin 1000 mg BID. He reports {excellent/good/fair/poor:19665} compliance with treatment. He {is/is not:21021397} having side effects. {document side effects if present:1} Symptoms: {Yes/No:20286} fatigue {Yes/No:20286} foot ulcerations  {Yes/No:20286} appetite changes {Yes/No:20286} nausea  {Yes/No:20286} paresthesia of the feet  {Yes/No:20286} polydipsia  {Yes/No:20286} polyuria {Yes/No:20286} visual disturbances   {Yes/No:20286} vomiting     Home blood sugar records: {diabetes glucometry results:16657}  Episodes of hypoglycemia? {Yes/No:20286} {enter symptoms and frequency of symptoms if yes:1}   Current insulin regiment: {enter 'none' or type of insulin and number of units taken with each dose of each insulin formulation that the patient is taking:1} Most Recent Eye Exam: *** {Current exercise:16438:::1} {Current diet habits:16563:::1}  Pertinent Labs: Lab Results  Component Value Date   CHOL 138 09/05/2021   HDL 45 09/05/2021   LDLCALC 71 09/05/2021   TRIG 124 09/05/2021   CHOLHDL 3.1 09/05/2021   Lab Results  Component Value Date   NA 140 09/05/2021   K 4.3 09/05/2021   CREATININE 0.85 09/05/2021   EGFR 101 09/05/2021   LABMICR 3.0 09/05/2021      ---------------------------------------------------------------------------------------------------   Medications: Outpatient Medications Prior to Visit  Medication Sig   acetaminophen (TYLENOL) 500 MG tablet Take 1,000-1,500 mg by mouth every 8 (eight) hours as needed for moderate pain.   Ascorbic Acid (VITAMIN C) 1000 MG tablet Take 1,000 mg by mouth daily.   atorvastatin (LIPITOR) 10 MG tablet Take 1 tablet (10 mg total) by mouth daily.   calcium carbonate (TUMS - DOSED IN MG ELEMENTAL CALCIUM) 500 MG chewable tablet Chew 1,000-1,500 mg by mouth daily as needed for indigestion or heartburn.   cetirizine (ZYRTEC) 10 MG tablet Take 1 tablet (10 mg total) by mouth daily.   diclofenac Sodium (VOLTAREN) 1 % GEL Apply 4 g topically 4 (four) times daily.   Dimethyl Fumarate 240 MG CPDR TAKE 1 CAPSULE (240 MG TOTAL) TWICE A DAY   fluticasone (FLONASE) 50 MCG/ACT nasal spray Place 2 sprays into both nostrils daily.   ibuprofen (ADVIL) 200 MG tablet Take 400-800 mg by mouth every 6 (six) hours as needed for moderate pain.   levothyroxine (SYNTHROID) 112 MCG tablet Take 1 tablet (112 mcg total) by mouth daily. Recommend repeat labs following 6 weeks of new dose.   loratadine (CLARITIN) 10 MG tablet Take 10 mg by mouth daily as needed for allergies.   meloxicam (MOBIC) 15 MG tablet Take 1 tablet (15 mg total) by mouth daily.   metFORMIN (GLUCOPHAGE) 1000 MG tablet Take 1 tablet (1,000 mg total) by mouth 2 (two) times daily with a meal.   Multiple Vitamin (MULTIVITAMIN WITH MINERALS) TABS tablet Take 1 tablet by mouth daily.   VITAMIN D PO Take 1 capsule by mouth daily.   Facility-Administered Medications Prior to Visit  Medication Dose Route Frequency Provider   betamethasone acetate-betamethasone sodium phosphate (CELESTONE) injection 3 mg  3 mg Intramuscular Once Edrick Kins, DPM    Review of Systems  {Labs  Heme  Chem  Endocrine  Serology  Results Review (optional):23779}    Objective    There were no vitals taken for this visit. {Show previous vital signs (optional):23777}  Physical Exam  ***  No results found for any visits on 12/06/21.  Assessment & Plan     ***  No follow-ups on file.      {provider attestation***:1}   Gwyneth Sprout, [place] [phone] (phone) [phone] (fax)  [place]

## 2021-12-05 ENCOUNTER — Other Ambulatory Visit: Payer: Self-pay | Admitting: Family Medicine

## 2021-12-05 DIAGNOSIS — E039 Hypothyroidism, unspecified: Secondary | ICD-10-CM

## 2021-12-05 NOTE — Telephone Encounter (Signed)
Called patient to advise that he has labs ordered in order to get a refill of med. Okay for PEC to advise.

## 2021-12-06 ENCOUNTER — Ambulatory Visit: Payer: BC Managed Care – PPO | Admitting: Family Medicine

## 2021-12-23 ENCOUNTER — Ambulatory Visit: Payer: BC Managed Care – PPO | Admitting: Family Medicine

## 2021-12-23 MED ORDER — LEVOTHYROXINE SODIUM 112 MCG PO TABS: 112.0000 ug | ORAL_TABLET | Freq: Every day | ORAL | 0 refills | Status: DC

## 2021-12-23 NOTE — Telephone Encounter (Signed)
Requested medication (s) are due for refill today: yes  Requested medication (s) are on the active medication list: yes  Last refill:  09/06/21 #90 with 0 RF  Future visit scheduled: 12/30/21  Notes to clinic:  pt has appt scheduled, was supposed to repeat lab at end of May to evaluate new dose. Has canceled 3 appt and NO SHOW today, is rescheduled for 12/30/21. Please assess      Requested Prescriptions  Pending Prescriptions Disp Refills   levothyroxine (SYNTHROID) 112 MCG tablet 90 tablet 0    Sig: Take 1 tablet (112 mcg total) by mouth daily. Recommend repeat labs following 6 weeks of new dose.     Endocrinology:  Hypothyroid Agents Failed - 12/23/2021  9:46 AM      Failed - TSH in normal range and within 360 days    TSH  Date Value Ref Range Status  09/05/2021 10.700 (H) 0.450 - 4.500 uIU/mL Final         Passed - Valid encounter within last 12 months    Recent Outpatient Visits           3 months ago Type 2 diabetes mellitus with diabetic neuropathy, without long-term current use of insulin [facility])   [facility] Tally Joe T, FNP   1 year ago Type 2 diabetes mellitus with other specified complication, without long-term current use of insulin [facility])   [facility] [place], Dionne Bucy, MD   1 year ago Controlled type 2 diabetes mellitus without complication, without long-term current use of insulin [facility])   [facility] [place], [place], PA-C   1 year ago Type 2 diabetes mellitus with hyperglycemia, without long-term current use of insulin [facility])   [facility] [place], Wendee Beavers, [place]   1 year ago Thoracic myelopathy   [facility] [place], Wendee Beavers, [place]       Future Appointments             In 1 week Gwyneth Sprout, [place], PEC            Refused Prescriptions Disp Refills   levothyroxine (SYNTHROID) 112 MCG tablet [Pharmacy Med Name: LEVOTHYROXINE 112 MCG TABLET] 90  tablet 0    Sig: TAKE 1 TABLET (112 MCG TOTAL) BY MOUTH DAILY. RECOMMEND REPEAT LABS FOLLOWING 6 WEEKS OF NEW DOSE.     Endocrinology:  Hypothyroid Agents Failed - 12/23/2021  9:46 AM      Failed - TSH in normal range and within 360 days    TSH  Date Value Ref Range Status  09/05/2021 10.700 (H) 0.450 - 4.500 uIU/mL Final         Passed - Valid encounter within last 12 months    Recent Outpatient Visits           3 months ago Type 2 diabetes mellitus with diabetic neuropathy, without long-term current use of insulin [facility])   [facility] Tally Joe T, FNP   1 year ago Type 2 diabetes mellitus with other specified complication, without long-term current use of insulin [facility])   [facility] [place], Dionne Bucy, MD   1 year ago Controlled type 2 diabetes mellitus without complication, without long-term current use of insulin [facility])   [facility] [place], [place], PA-C   1 year ago Type 2 diabetes mellitus with hyperglycemia, without long-term current use of insulin [facility])   [facility] [place], [place], [place]   1 year  ago Thoracic myelopathy   [facility] [place], [place]       Future Appointments             In 1 week Gwyneth Sprout, [place], PEC

## 2021-12-23 NOTE — Telephone Encounter (Signed)
Apt scheduled 12/30/21

## 2021-12-29 NOTE — Progress Notes (Signed)
Established patient visit   Patient: Benjamin Oliver   DOB: Jul 23, 1965   56 y.o. Male  MRN: [id number] Visit Date: 12/30/2021  Today's healthcare provider: Gwyneth Sprout, FNP  Re Introduced to nurse practitioner role and practice setting.  All questions answered.  Discussed provider/patient relationship and expectations.   I,Tiffany J Bragg,acting as a scribe for Gwyneth Sprout, FNP.,have documented all relevant documentation on the behalf of Gwyneth Sprout, FNP,as directed by  Gwyneth Sprout, FNP while in the presence of Gwyneth Sprout, FNP.   Chief Complaint  Patient presents with   Diabetes   Hypothyroidism   Subjective    HPI  Diabetes Mellitus Type II, Follow-up  Lab Results  Component Value Date   HGBA1C 6.4 (H) 09/05/2021   HGBA1C 6.2 (A) 09/16/2020   HGBA1C 6.7 (A) 05/26/2020   Wt Readings from Last 3 Encounters:  12/30/21 210 lb (95.3 kg)  09/05/21 212 lb 11.2 oz (96.5 kg)  09/22/20 201 lb (91.2 kg)   Last seen for diabetes 3 months ago.  Management since then includes continue current medication. He reports excellent compliance with treatment. He is not having side effects.  Symptoms: No fatigue No foot ulcerations  No appetite changes No nausea  No paresthesia of the feet  No polydipsia  No polyuria No visual disturbances   No vomiting     Home blood sugar records:  not checked  Episodes of hypoglycemia? No    Current insulin regiment: none Most Recent Eye Exam: a year ago.  Current exercise: work is exercise. Current diet habits: in general, a "healthy" diet    Pertinent Labs: Lab Results  Component Value Date   CHOL 138 09/05/2021   HDL 45 09/05/2021   LDLCALC 71 09/05/2021   TRIG 124 09/05/2021   CHOLHDL 3.1 09/05/2021   Lab Results  Component Value Date   NA 140 09/05/2021   K 4.3 09/05/2021   CREATININE 0.85 09/05/2021   EGFR 101 09/05/2021   LABMICR 3.0 09/05/2021      ---------------------------------------------------------------------------------------------------  Hypothyroid, follow-up  Lab Results  Component Value Date   TSH 10.700 (H) 09/05/2021   TSH 5.740 (H) 09/16/2020   TSH 3.620 04/13/2020   FREET4 1.05 09/05/2021   FREET4 0.74 (L) 09/11/2017   T4TOTAL 6.4 05/31/2018    Wt Readings from Last 3 Encounters:  12/30/21 210 lb (95.3 kg)  09/05/21 212 lb 11.2 oz (96.5 kg)  09/22/20 201 lb (91.2 kg)    He was last seen for hypothyroid 3 months ago.  Management since that visit includes TSH remains under treated. Recommend increase dose of Levothyroxine for 6 weeks with repeat labs. . He reports excellent compliance with treatment. He is not having side effects.   Symptoms: No change in energy level No constipation  Yes diarrhea No heat / cold intolerance  No nervousness No palpitations  No weight changes    -----------------------------------------------------------------------------------------  Medications: Outpatient Medications Prior to Visit  Medication Sig   acetaminophen (TYLENOL) 500 MG tablet Take 1,000-1,500 mg by mouth every 8 (eight) hours as needed for moderate pain.   Ascorbic Acid (VITAMIN C) 1000 MG tablet Take 1,000 mg by mouth daily.   atorvastatin (LIPITOR) 10 MG tablet Take 1 tablet (10 mg total) by mouth daily.   calcium carbonate (TUMS - DOSED IN MG ELEMENTAL CALCIUM) 500 MG chewable tablet Chew 1,000-1,500 mg by mouth daily as needed for indigestion or heartburn.   diclofenac Sodium (VOLTAREN)  1 % GEL Apply 4 g topically 4 (four) times daily.   fluticasone (FLONASE) 50 MCG/ACT nasal spray Place 2 sprays into both nostrils daily.   ibuprofen (ADVIL) 200 MG tablet Take 400-800 mg by mouth every 6 (six) hours as needed for moderate pain.   levothyroxine (SYNTHROID) 112 MCG tablet Take 1 tablet (112 mcg total) by mouth daily. Recommend repeat labs following 6 weeks of new dose.   loratadine (CLARITIN) 10 MG  tablet Take 10 mg by mouth daily as needed for allergies.   metFORMIN (GLUCOPHAGE) 1000 MG tablet Take 1 tablet (1,000 mg total) by mouth 2 (two) times daily with a meal.   Multiple Vitamin (MULTIVITAMIN WITH MINERALS) TABS tablet Take 1 tablet by mouth daily.   VITAMIN D PO Take 1 capsule by mouth daily.   cetirizine (ZYRTEC) 10 MG tablet Take 1 tablet (10 mg total) by mouth daily. (Patient not taking: Reported on 12/30/2021)   Dimethyl Fumarate 240 MG CPDR TAKE 1 CAPSULE (240 MG TOTAL) TWICE A DAY (Patient not taking: Reported on 12/30/2021)   meloxicam (MOBIC) 15 MG tablet Take 1 tablet (15 mg total) by mouth daily. (Patient not taking: Reported on 12/30/2021)   Facility-Administered Medications Prior to Visit  Medication Dose Route Frequency Provider   betamethasone acetate-betamethasone sodium phosphate (CELESTONE) injection 3 mg  3 mg Intramuscular Once Edrick Kins, DPM    Review of Systems  Last CBC Lab Results  Component Value Date   WBC 9.0 09/22/2020   HGB 14.7 09/22/2020   HCT 44.2 09/22/2020   MCV 92 09/22/2020   MCH 30.6 09/22/2020   RDW 13.1 09/22/2020   PLT 271 08/65/7846   Last metabolic panel Lab Results  Component Value Date   GLUCOSE 86 09/05/2021   NA 140 09/05/2021   K 4.3 09/05/2021   CL 103 09/05/2021   CO2 22 09/05/2021   BUN 16 09/05/2021   CREATININE 0.85 09/05/2021   EGFR 101 09/05/2021   CALCIUM 9.2 09/05/2021   PROT 6.5 09/05/2021   ALBUMIN 4.7 09/05/2021   LABGLOB 1.8 09/05/2021   AGRATIO 2.6 (H) 09/05/2021   BILITOT <0.2 09/05/2021   ALKPHOS 73 09/05/2021   AST 16 09/05/2021   ALT 20 09/05/2021   ANIONGAP 5 (L) 10/10/2012   Last lipids Lab Results  Component Value Date   CHOL 138 09/05/2021   HDL 45 09/05/2021   LDLCALC 71 09/05/2021   TRIG 124 09/05/2021   CHOLHDL 3.1 09/05/2021   Last hemoglobin A1c Lab Results  Component Value Date   HGBA1C 6.4 (H) 09/05/2021   Last thyroid functions Lab Results  Component Value Date    TSH 10.700 (H) 09/05/2021   T4TOTAL 6.4 05/31/2018     Objective    BP 110/74 (BP Location: Left Arm, Patient Position: Sitting, Cuff Size: Normal)   Pulse (!) 54   Temp 98.6 F (37 C) (Oral)   Resp 16   Ht 6' (1.829 m)   Wt 210 lb (95.3 kg)   SpO2 94%   BMI 28.48 kg/m   BP Readings from Last 3 Encounters:  12/30/21 110/74  09/05/21 123/82  09/22/20 106/62   Wt Readings from Last 3 Encounters:  12/30/21 210 lb (95.3 kg)  09/05/21 212 lb 11.2 oz (96.5 kg)  09/22/20 201 lb (91.2 kg)   SpO2 Readings from Last 3 Encounters:  12/30/21 94%  09/05/21 97%  09/16/20 98%      Physical Exam Vitals and nursing note reviewed.  Constitutional:  Appearance: Normal appearance. He is overweight.  HENT:     Head: Normocephalic and atraumatic.  Eyes:     Pupils: Pupils are equal, round, and reactive to light.  Cardiovascular:     Rate and Rhythm: Normal rate and regular rhythm.     Pulses: Normal pulses.     Heart sounds: Normal heart sounds.  Pulmonary:     Effort: Pulmonary effort is normal.     Breath sounds: Normal breath sounds.  Musculoskeletal:        General: Normal range of motion.     Cervical back: Normal range of motion.  Skin:    General: Skin is warm and dry.     Capillary Refill: Capillary refill takes less than 2 seconds.  Neurological:     General: No focal deficit present.     Mental Status: He is alert and oriented to person, place, and time. Mental status is at baseline.  Psychiatric:        Attention and Perception: Attention normal.        Mood and Affect: Affect is blunt and flat.        Speech: Speech normal.        Behavior: Behavior normal. Behavior is cooperative.        Thought Content: Thought content normal.        Cognition and Memory: Cognition and memory normal.        Judgment: Judgment normal.     No results found for any visits on 12/30/21.  Assessment & Plan     Problem List Items Addressed This Visit       Endocrine    Diabetes mellitus ([place])    Chronic, stable at 6.4%, repeat A1c Reports compliance with 1000 mg metformin BID; however, has also taken some older doses of 750 mg Continue to recommend balanced, lower carb meals. Smaller meal size, adding snacks. Choosing water as drink of choice and increasing purposeful exercise.       Relevant Orders   Hemoglobin A1c   Hypothyroidism - Primary    Chronic, unstable Repeat labs Was increased from 75 to 112 mcg levothyroxine  Reports poor compliance with medication       Relevant Orders   TSH + free T4     Return in about 3 months (around 04/01/2022) for annual examination- made office manager aware that patient would like to change PCPs.      Vonna Kotyk, FNP, have reviewed all documentation for this visit. The documentation on 12/30/21 for the exam, diagnosis, procedures, and orders are all accurate and complete.    Gwyneth Sprout, [place] [phone] (phone) [phone] (fax)  [place]

## 2021-12-30 ENCOUNTER — Ambulatory Visit (INDEPENDENT_AMBULATORY_CARE_PROVIDER_SITE_OTHER): Payer: BC Managed Care – PPO | Admitting: Family Medicine

## 2021-12-30 ENCOUNTER — Encounter: Payer: Self-pay | Admitting: Family Medicine

## 2021-12-30 VITALS — BP 110/74 | HR 54 | Temp 98.6°F | Resp 16 | Ht 72.0 in | Wt 210.0 lb

## 2021-12-30 DIAGNOSIS — E1140 Type 2 diabetes mellitus with diabetic neuropathy, unspecified: Secondary | ICD-10-CM

## 2021-12-30 DIAGNOSIS — E039 Hypothyroidism, unspecified: Secondary | ICD-10-CM | POA: Diagnosis not present

## 2021-12-30 NOTE — Assessment & Plan Note (Signed)
Chronic, stable at 6.4%, repeat A1c Reports compliance with 1000 mg metformin BID; however, has also taken some older doses of 750 mg Continue to recommend balanced, lower carb meals. Smaller meal size, adding snacks. Choosing water as drink of choice and increasing purposeful exercise.

## 2021-12-30 NOTE — Assessment & Plan Note (Addendum)
Chronic, unstable Repeat labs Was increased from 75 to 112 mcg levothyroxine  Reports poor compliance with medication

## 2021-12-31 LAB — HEMOGLOBIN A1C
Est. average glucose Bld gHb Est-mCnc: 137 mg/dL
Hgb A1c MFr Bld: 6.4 % — ABNORMAL HIGH (ref 4.8–5.6)

## 2021-12-31 LAB — TSH+FREE T4
Free T4: 1.38 ng/dL (ref 0.82–1.77)
TSH: 4.99 u[IU]/mL — ABNORMAL HIGH (ref 0.450–4.500)

## 2022-01-02 ENCOUNTER — Other Ambulatory Visit: Payer: Self-pay | Admitting: Family Medicine

## 2022-01-02 DIAGNOSIS — E039 Hypothyroidism, unspecified: Secondary | ICD-10-CM

## 2022-01-02 MED ORDER — LEVOTHYROXINE SODIUM 125 MCG PO TABS: 125.0000 ug | ORAL_TABLET | Freq: Every day | ORAL | 0 refills | Status: DC

## 2022-01-02 NOTE — Progress Notes (Signed)
Thyroid is improved; however, not at goal. Recommend slight increase of dose to assist; depending on how many days you have been out of medication.  A1c remains improved; pre-diabetic range at 6.4% Continue to recommend balanced, lower carb meals. Smaller meal size, adding snacks. Choosing water as drink of choice and increasing purposeful exercise.  Gwyneth Sprout, [address] [place], [place] [address] [phone] (phone) [phone] (fax) [place]

## 2022-01-03 ENCOUNTER — Other Ambulatory Visit: Payer: Self-pay | Admitting: Family Medicine

## 2022-01-03 DIAGNOSIS — E1140 Type 2 diabetes mellitus with diabetic neuropathy, unspecified: Secondary | ICD-10-CM

## 2022-01-03 DIAGNOSIS — E039 Hypothyroidism, unspecified: Secondary | ICD-10-CM

## 2022-01-03 DIAGNOSIS — E1165 Type 2 diabetes mellitus with hyperglycemia: Secondary | ICD-10-CM

## 2022-01-03 NOTE — Telephone Encounter (Signed)
Called [facility] [place], Pt has not picked up Levothyroxine yet and Metformin and atorvastatin had 1 refill left. Joe stated he will get rx ready for pt to pickup  Requested Prescriptions  Refused Prescriptions Disp Refills  . metFORMIN (GLUCOPHAGE) 1000 MG tablet 180 tablet 1    Sig: Take 1 tablet (1,000 mg total) by mouth 2 (two) times daily with a meal.     Endocrinology:  Diabetes - Biguanides Failed - 01/03/2022  3:31 PM      Failed - B12 Level in normal range and within 720 days    No results found for: "VITAMINB12"       Failed - CBC within normal limits and completed in the last 12 months    WBC  Date Value Ref Range Status  09/22/2020 9.0 3.4 - 10.8 x10E3/uL Final  10/10/2012 8.8 3.8 - 10.6 x10 3/mm 3 Final   RBC  Date Value Ref Range Status  09/22/2020 4.81 4.14 - 5.80 x10E6/uL Final  10/10/2012 4.80 4.40 - 5.90 x10 6/mm 3 Final   Hemoglobin  Date Value Ref Range Status  09/22/2020 14.7 13.0 - 17.7 g/dL Final   Hematocrit  Date Value Ref Range Status  09/22/2020 44.2 37.5 - 51.0 % Final   MCHC  Date Value Ref Range Status  09/22/2020 33.3 31.5 - 35.7 g/dL Final  10/10/2012 34.2 32.0 - 36.0 g/dL Final   [facility]  Date Value Ref Range Status  09/22/2020 30.6 26.6 - 33.0 pg Final  10/10/2012 30.7 26.0 - 34.0 pg Final   MCV  Date Value Ref Range Status  09/22/2020 92 79 - 97 fL Final  10/10/2012 90 80 - 100 fL Final   No results found for: "PLTCOUNTKUC", "LABPLAT", "POCPLA" RDW  Date Value Ref Range Status  09/22/2020 13.1 11.6 - 15.4 % Final  10/10/2012 13.6 11.5 - 14.5 % Final         Passed - Cr in normal range and within 360 days    Creatinine  Date Value Ref Range Status  10/10/2012 0.85 0.60 - 1.30 mg/dL Final   Creatinine, Ser  Date Value Ref Range Status  09/05/2021 0.85 0.76 - 1.27 mg/dL Final         Passed - HBA1C is between 0 and 7.9 and within 180 days    Hgb A1c MFr Bld  Date Value Ref Range Status  12/30/2021 6.4 (H) 4.8 - 5.6 % Final     Comment:             Prediabetes: 5.7 - 6.4          Diabetes: >6.4          Glycemic control for adults with diabetes: <7.0          Passed - eGFR in normal range and within 360 days    EGFR (African American)  Date Value Ref Range Status  10/10/2012 >60  Final   GFR calc Af Amer  Date Value Ref Range Status  02/10/2020 109 >59 mL/min/1.73 Final    Comment:    **[facility] currently reports eGFR in compliance with the current**   recommendations of the [employer]. [facility] will   update reporting as new guidelines are published from the NKF-ASN   Task force.    EGFR (Non-African Amer.)  Date Value Ref Range Status  10/10/2012 >60  Final    Comment:    eGFR values <38m/min/1.73 m2 may be an indication of chronic kidney disease (CKD). Calculated eGFR is  useful in patients with stable renal function. The eGFR calculation will not be reliable in acutely ill patients when serum creatinine is changing rapidly. It is not useful in  patients on dialysis. The eGFR calculation may not be applicable to patients at the low and high extremes of body sizes, pregnant women, and vegetarians. POTASSIUM,AST - Slight hemolysis, interpret results with  - caution.    GFR calc non Af Amer  Date Value Ref Range Status  02/10/2020 95 >59 mL/min/1.73 Final   eGFR  Date Value Ref Range Status  09/05/2021 101 >59 mL/min/1.73 Final         Passed - Valid encounter within last 6 months    Recent Outpatient Visits          4 days ago Hypothyroidism, unspecified type   [facility] Tally Joe T, FNP   4 months ago Type 2 diabetes mellitus with diabetic neuropathy, without long-term current use of insulin [facility])   [facility] Tally Joe T, FNP   1 year ago Type 2 diabetes mellitus with other specified complication, without long-term current use of insulin [facility])   [facility] [place], Dionne Bucy, MD   1 year ago Controlled type 2  diabetes mellitus without complication, without long-term current use of insulin [facility])   [place], [place], [place]   1 year ago Type 2 diabetes mellitus with hyperglycemia, without long-term current use of insulin [facility])   [place], Adriana M, [place]             . levothyroxine (SYNTHROID) 125 MCG tablet 90 tablet 0    Sig: Take 1 tablet (125 mcg total) by mouth daily.     Endocrinology:  Hypothyroid Agents Failed - 01/03/2022  3:31 PM      Failed - TSH in normal range and within 360 days    TSH  Date Value Ref Range Status  12/30/2021 4.990 (H) 0.450 - 4.500 uIU/mL Final         Passed - Valid encounter within last 12 months    Recent Outpatient Visits          4 days ago Hypothyroidism, unspecified type   [facility] Tally Joe T, FNP   4 months ago Type 2 diabetes mellitus with diabetic neuropathy, without long-term current use of insulin [facility])   [facility] T, FNP   1 year ago Type 2 diabetes mellitus with other specified complication, without long-term current use of insulin [facility])   [facility] [place], Dionne Bucy, MD   1 year ago Controlled type 2 diabetes mellitus without complication, without long-term current use of insulin [facility])   [facility] [place], [place], [place]   1 year ago Type 2 diabetes mellitus with hyperglycemia, without long-term current use of insulin [facility])   [place], Adriana M, [place]             . atorvastatin (LIPITOR) 10 MG tablet 90 tablet 1    Sig: Take 1 tablet (10 mg total) by mouth daily.     Cardiovascular:  Antilipid - Statins Failed - 01/03/2022  3:31 PM      Failed - Lipid Panel in normal range within the last 12 months    Cholesterol, Total  Date Value Ref Range Status  09/05/2021 138 100 - 199 mg/dL Final   LDL Chol Calc (NIH)  Date Value Ref Range Status  09/05/2021 71 0 -  99 mg/dL  Final   HDL  Date Value Ref Range Status  09/05/2021 45 >39 mg/dL Final   Triglycerides  Date Value Ref Range Status  09/05/2021 124 0 - 149 mg/dL Final         Passed - Patient is not pregnant      Passed - Valid encounter within last 12 months    Recent Outpatient Visits          4 days ago Hypothyroidism, unspecified type   [facility] Tally Joe T, FNP   4 months ago Type 2 diabetes mellitus with diabetic neuropathy, without long-term current use of insulin [facility])   [facility] Tally Joe T, FNP   1 year ago Type 2 diabetes mellitus with other specified complication, without long-term current use of insulin [facility])   [facility] [place], Dionne Bucy, MD   1 year ago Controlled type 2 diabetes mellitus without complication, without long-term current use of insulin [facility])   [place], [place], PA-C   1 year ago Type 2 diabetes mellitus with hyperglycemia, without long-term current use of insulin [facility])   [place], [place], [place]

## 2022-01-03 NOTE — Telephone Encounter (Signed)
Medication Refill - Medication: levothyroxine (SYNTHROID) 125 MCG tablet, metFORMIN (GLUCOPHAGE) 1000 MG tablet, atorvastatin (LIPITOR) 10 MG tablet  Has the patient contacted their pharmacy? Yes.     Preferred Pharmacy (with phone number or street name):  [id number] [place] [place]:  [phone] Fax:  [phone]    Has the patient been seen for an appointment in the last year OR does the patient have an upcoming appointment? Yes.    Patient saw the provider on Friday and thought she was going to call these refills in to his pharmacy but he still does not have them. He is out of his Metformin and Atorvastatin altogether so he needs them as soon as possible. Please assist patient further

## 2022-02-10 ENCOUNTER — Ambulatory Visit: Payer: Self-pay

## 2022-02-10 ENCOUNTER — Other Ambulatory Visit: Payer: Self-pay | Admitting: Family Medicine

## 2022-02-10 DIAGNOSIS — E1140 Type 2 diabetes mellitus with diabetic neuropathy, unspecified: Secondary | ICD-10-CM

## 2022-02-10 MED ORDER — METFORMIN HCL ER 500 MG PO TB24: 1000.0000 mg | ORAL_TABLET | Freq: Two times a day (BID) | ORAL | 0 refills | Status: DC

## 2022-02-10 NOTE — Telephone Encounter (Signed)
Reason for Disposition  [1] Caller has URGENT medicine question about med that PCP or specialist prescribed AND [2] triager unable to answer question  Answer Assessment - Initial Assessment Questions 1. NAME of MEDICINE: "What medicine(s) are you calling about?"     Metformin    I mentioned I was having diarrhea when I saw the provider recently but I didn't know it was related to the metformin.   My wife looked it up on line since I've been having the diarrhea since starting the metformin.   It's a common side effect of the metformin.    I've had diarrhea for several months.   I stopped taking it and I feel better.     I stopped it 3 days ago and the diarrhea has stopped.     I know I'm not supposed to stop it but the diarrhea was really bothering me. 2. QUESTION: "What is your question?" (e.g., double dose of medicine, side effect)     Can I switch to the extended release?   My pharmacist recommended it. 3. PRESCRIBER: "Who prescribed the medicine?" Reason: if prescribed by specialist, call should be referred to that group.     Tally Joe, FNP 4. SYMPTOMS: "Do you have any symptoms?" If Yes, ask: "What symptoms are you having?"  "How bad are the symptoms (e.g., mild, moderate, severe)     Diarrhea that stopped when I stopped the metformin. 5. PREGNANCY:  "Is there any chance that you are pregnant?" "When was your last menstrual period?"     N/A  Protocols used: Medication Question Call-A-AH  Chief Complaint: diarrhea from metformin.   Can switch to extended release version Symptoms: diarrhea since starting the metformin Frequency: daily Pertinent Negatives: Patient denies N/A Disposition: '[]'$ ED /'[]'$ Urgent Care (no appt availability in office) / '[]'$ Appointment(In office/virtual)/ '[]'$  [place] [facility]/ '[]'$ Home Care/ '[]'$ Refused Recommended Disposition /'[]'$ [place] [facility]/ '[x]'$  Follow-up with PCP Additional Notes: Message sent to Tally Joe, FNP with pt's request for extended release  metformin.

## 2022-02-10 NOTE — Telephone Encounter (Signed)
Summary: medication reaction   Pt states he has had diarrhea for several months due to metformin   Pt states he has not taken the medication in a week and diarrhea has subsided   Pt inquiring if he can switch to metformin extended release   Pt states if he does not answer the return call, he will be asleep   Please assist further       Called pt - [id number]

## 2022-04-01 ENCOUNTER — Other Ambulatory Visit: Payer: Self-pay | Admitting: Family Medicine

## 2022-04-01 DIAGNOSIS — E039 Hypothyroidism, unspecified: Secondary | ICD-10-CM

## 2022-04-03 NOTE — Telephone Encounter (Signed)
Requested Prescriptions  Pending Prescriptions Disp Refills   levothyroxine (SYNTHROID) 125 MCG tablet [Pharmacy Med Name: LEVOTHYROXINE 125 MCG TABLET] 90 tablet 0    Sig: TAKE 1 TABLET BY MOUTH EVERY DAY     Endocrinology:  Hypothyroid Agents Failed - 04/01/2022  8:57 AM      Failed - TSH in normal range and within 360 days    TSH  Date Value Ref Range Status  12/30/2021 4.990 (H) 0.450 - 4.500 uIU/mL Final         Passed - Valid encounter within last 12 months    Recent Outpatient Visits           3 months ago Hypothyroidism, unspecified type   [facility] Tally Joe T, FNP   7 months ago Type 2 diabetes mellitus with diabetic neuropathy, without long-term current use of insulin [facility])   [facility] Tally Joe T, FNP   1 year ago Type 2 diabetes mellitus with other specified complication, without long-term current use of insulin [facility]   [facility] [place], Dionne Bucy, MD   1 year ago Controlled type 2 diabetes mellitus without complication, without long-term current use of insulin [facility])   [facility] [place], [place], [place]   1 year ago Type 2 diabetes mellitus with hyperglycemia, without long-term current use of insulin [facility])   [place], [place], [place]

## 2022-05-09 ENCOUNTER — Other Ambulatory Visit: Payer: Self-pay | Admitting: Family Medicine

## 2022-05-09 DIAGNOSIS — E1140 Type 2 diabetes mellitus with diabetic neuropathy, unspecified: Secondary | ICD-10-CM

## 2022-07-03 ENCOUNTER — Other Ambulatory Visit: Payer: Self-pay | Admitting: Family Medicine

## 2022-07-03 DIAGNOSIS — E1165 Type 2 diabetes mellitus with hyperglycemia: Secondary | ICD-10-CM

## 2022-07-08 ENCOUNTER — Other Ambulatory Visit: Payer: Self-pay | Admitting: Family Medicine

## 2022-07-08 DIAGNOSIS — E039 Hypothyroidism, unspecified: Secondary | ICD-10-CM

## 2022-09-28 ENCOUNTER — Other Ambulatory Visit: Payer: Self-pay | Admitting: Family Medicine

## 2022-09-28 DIAGNOSIS — E1165 Type 2 diabetes mellitus with hyperglycemia: Secondary | ICD-10-CM

## 2022-09-28 NOTE — Telephone Encounter (Signed)
Requested medication (s) are due for refill today: yes  Requested medication (s) are on the active medication list: yes  Last refill:  07/03/22  Future visit scheduled: no  Notes to clinic:  Unable to refill per protocol, courtesy refill already given, routing for provider approval.      Requested Prescriptions  Pending Prescriptions Disp Refills   atorvastatin (LIPITOR) 10 MG tablet [Pharmacy Med Name: ATORVASTATIN 10 MG TABLET] 90 tablet 0    Sig: Take 1 tablet (10 mg total) by mouth daily. Please schedule office visit before any future refill.     Cardiovascular:  Antilipid - Statins Failed - 09/28/2022  8:30 AM      Failed - Lipid Panel in normal range within the last 12 months    Cholesterol, Total  Date Value Ref Range Status  09/05/2021 138 100 - 199 mg/dL Final   LDL Chol Calc (NIH)  Date Value Ref Range Status  09/05/2021 71 0 - 99 mg/dL Final   HDL  Date Value Ref Range Status  09/05/2021 45 >39 mg/dL Final   Triglycerides  Date Value Ref Range Status  09/05/2021 124 0 - 149 mg/dL Final         Passed - Patient is not pregnant      Passed - Valid encounter within last 12 months    Recent Outpatient Visits           9 months ago Hypothyroidism, unspecified type   [facility] Merita Norton T, FNP   1 year ago Type 2 diabetes mellitus with diabetic neuropathy, without long-term current use of insulin [facility])   [facility] Merita Norton T, FNP   2 years ago Type 2 diabetes mellitus with other specified complication, without long-term current use of insulin [facility])   [place] [facility] [place], Marzella Schlein, MD   2 years ago Controlled type 2 diabetes mellitus without complication, without long-term current use of insulin [facility])   [place] [facility] [place], [place], [place]   2 years ago Type 2 diabetes mellitus with hyperglycemia, without long-term current use of insulin [facility])    [facility] [place], [place], [place]
